# Patient Record
Sex: Female | Born: 1960 | Race: Black or African American | Hispanic: No | Smoking: Never smoker
Health system: Southern US, Community
[De-identification: ages and names within clinical notes are randomized; demographics above are authoritative.]

## PROBLEM LIST (undated history)

## (undated) DIAGNOSIS — M1990 Unspecified osteoarthritis, unspecified site: Secondary | ICD-10-CM

## (undated) DIAGNOSIS — T7840XA Allergy, unspecified, initial encounter: Secondary | ICD-10-CM

## (undated) DIAGNOSIS — J45909 Unspecified asthma, uncomplicated: Secondary | ICD-10-CM

## (undated) HISTORY — DX: Allergy, unspecified, initial encounter: T78.40XA

## (undated) HISTORY — PX: BREAST BIOPSY: SHX20

## (undated) HISTORY — DX: Unspecified osteoarthritis, unspecified site: M19.90

---

## 2015-10-10 DIAGNOSIS — M47816 Spondylosis without myelopathy or radiculopathy, lumbar region: Secondary | ICD-10-CM | POA: Insufficient documentation

## 2016-12-05 DIAGNOSIS — J4520 Mild intermittent asthma, uncomplicated: Secondary | ICD-10-CM | POA: Insufficient documentation

## 2019-01-09 LAB — HM COLONOSCOPY

## 2020-07-04 DIAGNOSIS — Z124 Encounter for screening for malignant neoplasm of cervix: Secondary | ICD-10-CM | POA: Diagnosis not present

## 2020-07-04 DIAGNOSIS — Z113 Encounter for screening for infections with a predominantly sexual mode of transmission: Secondary | ICD-10-CM | POA: Diagnosis not present

## 2020-07-04 DIAGNOSIS — Z01419 Encounter for gynecological examination (general) (routine) without abnormal findings: Secondary | ICD-10-CM | POA: Diagnosis not present

## 2020-10-03 ENCOUNTER — Encounter (HOSPITAL_COMMUNITY): Payer: Self-pay

## 2020-10-03 ENCOUNTER — Emergency Department (HOSPITAL_COMMUNITY)
Admission: EM | Admit: 2020-10-03 | Discharge: 2020-10-03 | Disposition: A | Discharge status: Home / Self Care | Payer: BC Managed Care – PPO | Attending: Emergency Medicine | Admitting: Emergency Medicine

## 2020-10-03 DIAGNOSIS — Z5321 Procedure and treatment not carried out due to patient leaving prior to being seen by health care provider: Secondary | ICD-10-CM | POA: Insufficient documentation

## 2020-10-03 DIAGNOSIS — R109 Unspecified abdominal pain: Secondary | ICD-10-CM | POA: Diagnosis not present

## 2020-10-03 NOTE — ED Triage Notes (Signed)
Pt presents with c/o abdominal pain that occurred approx one hour ago after eating a passion fruit. Pt reports pain is in the center of her abdomen, pain 5/10. Pt denies any vomiting or diarrhea since the pain started.

## 2021-01-05 ENCOUNTER — Ambulatory Visit (INDEPENDENT_AMBULATORY_CARE_PROVIDER_SITE_OTHER): Payer: BC Managed Care – PPO | Admitting: Sports Medicine

## 2021-01-05 ENCOUNTER — Other Ambulatory Visit: Payer: Self-pay

## 2021-01-05 ENCOUNTER — Encounter: Payer: Self-pay | Admitting: Sports Medicine

## 2021-01-05 DIAGNOSIS — M79671 Pain in right foot: Secondary | ICD-10-CM | POA: Diagnosis not present

## 2021-01-05 DIAGNOSIS — M79672 Pain in left foot: Secondary | ICD-10-CM | POA: Diagnosis not present

## 2021-01-05 DIAGNOSIS — B351 Tinea unguium: Secondary | ICD-10-CM | POA: Diagnosis not present

## 2021-01-05 DIAGNOSIS — B359 Dermatophytosis, unspecified: Secondary | ICD-10-CM

## 2021-01-05 MED ORDER — TERBINAFINE HCL 250 MG PO TABS: 250.0000 mg | ORAL_TABLET | Freq: Every day | ORAL | 0 refills | Status: DC

## 2021-01-05 MED ORDER — CLOTRIMAZOLE 1 % EX SOLN: 1.0000 "application " | Freq: Two times a day (BID) | CUTANEOUS | 5 refills | Status: DC

## 2021-01-05 MED ORDER — NYSTATIN-TRIAMCINOLONE 100000-0.1 UNIT/GM-% EX OINT: 1.0000 "application " | TOPICAL_OINTMENT | Freq: Every day | CUTANEOUS | 1 refills | Status: DC
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
## 2021-01-23 LAB — HM MAMMOGRAPHY

## 2021-02-09 ENCOUNTER — Ambulatory Visit: Payer: BC Managed Care – PPO | Admitting: Sports Medicine

## 2021-03-16 ENCOUNTER — Other Ambulatory Visit: Payer: Self-pay

## 2021-03-16 ENCOUNTER — Ambulatory Visit (INDEPENDENT_AMBULATORY_CARE_PROVIDER_SITE_OTHER): Payer: BC Managed Care – PPO | Admitting: Sports Medicine

## 2021-03-16 ENCOUNTER — Encounter: Payer: Self-pay | Admitting: Sports Medicine

## 2021-03-16 DIAGNOSIS — M79671 Pain in right foot: Secondary | ICD-10-CM

## 2021-03-16 DIAGNOSIS — B351 Tinea unguium: Secondary | ICD-10-CM | POA: Diagnosis not present

## 2021-03-16 DIAGNOSIS — M79672 Pain in left foot: Secondary | ICD-10-CM

## 2021-03-16 DIAGNOSIS — B359 Dermatophytosis, unspecified: Secondary | ICD-10-CM | POA: Diagnosis not present

## 2021-03-16 DIAGNOSIS — Z79899 Other long term (current) drug therapy: Secondary | ICD-10-CM

## 2021-03-16 MED ORDER — NYSTATIN-TRIAMCINOLONE 100000-0.1 UNIT/GM-% EX OINT: 1.0000 "application " | TOPICAL_OINTMENT | Freq: Every day | CUTANEOUS | 1 refills | Status: DC

## 2021-03-16 NOTE — Progress Notes (Signed)
Subjective: Mikayla Austin is a 60 y.o. female patient seen today in office for follow-up evaluation of tinea.  Patient reports that she has not started the oral Lamisil she was concerned about possible side effects and want to discuss it first or either have a blood test before beginning this medication.  Patient reports that she has been using the solution and cream denies any other pedal complaints at this time currently.  There are no problems to display for this patient.   Current Outpatient Medications on File Prior to Visit  Medication Sig Dispense Refill   clotrimazole (LOTRIMIN) 1 % external solution Apply 1 application topically 2 (two) times daily. In between toes and to nails 60 mL 5   terbinafine (LAMISIL) 250 MG tablet Take 1 tablet (250 mg total) by mouth daily. 30 tablet 0   No current facility-administered medications on file prior to visit.    Allergies  Allergen Reactions   Chlorpheniramine     Objective: Physical Exam  General: Well developed, nourished, no acute distress, awake, alert and oriented x 3  Vascular: Dorsalis pedis artery 2/4 bilateral, Posterior tibial artery 2/4 bilateral, skin temperature warm to warm proximal to distal bilateral lower extremities, no varicosities, pedal hair present bilateral.  Neurological: Gross sensation present via light touch bilateral.   Dermatological: Skin is warm, dry, and supple bilateral, Nails 1-10 are tender, short thick, and discolored with mild subungal debris, + scaling plantar surfaces of both feet, no webspace macerations present bilateral, no open lesions present bilateral,+ soft tissue swelling/lipomas at ankles L>R. No signs of infection bilateral.  Musculoskeletal: Pes planus boney deformities noted bilateral. No significant instability of ankles, history of ankle sprains. Muscular strength within normal limits without pain on range of motion. No pain with calf compression bilateral.  Assessment and Plan:   Problem List Items Addressed This Visit   None Visit Diagnoses     Encounter for long-term current use of medication    -  Primary   Relevant Orders   Hepatic Function Panel   Tinea       Relevant Medications   nystatin-triamcinolone ointment (MYCOLOG)   Nail fungus       Relevant Medications   nystatin-triamcinolone ointment (MYCOLOG)   Foot pain, bilateral          -Examined patient -Re-Discussed treatment options for tinea, and nail changes likely secondary onychomycosis -Continue with clotrimazole solution to nails and in between toes -Refilled Mycolog to use at bedtime -Continue with foot miracle cream or daily skin emollients to use as directed after shower -Rx liver panel and if normal we will call patient and start her on oral Lamisil she already has 30 tablets since she previously pick up the prescription but did not take it yet -Recommend good hygiene habits -Return to office after blood work or sooner if problems or issues arise. Asencion Islam, DPM

## 2021-03-17 DIAGNOSIS — Z79899 Other long term (current) drug therapy: Secondary | ICD-10-CM | POA: Diagnosis not present

## 2021-03-18 LAB — HEPATIC FUNCTION PANEL
AG Ratio: 1.7 (calc) (ref 1.0–2.5)
ALT: 15 U/L (ref 6–29)
AST: 18 U/L (ref 10–35)
Albumin: 4.4 g/dL (ref 3.6–5.1)
Alkaline phosphatase (APISO): 60 U/L (ref 37–153)
Bilirubin, Direct: 0.2 mg/dL (ref 0.0–0.2)
Globulin: 2.6 g/dL (calc) (ref 1.9–3.7)
Indirect Bilirubin: 0.8 mg/dL (calc) (ref 0.2–1.2)
Total Bilirubin: 1 mg/dL (ref 0.2–1.2)
Total Protein: 7 g/dL (ref 6.1–8.1)

## 2021-03-19 ENCOUNTER — Other Ambulatory Visit: Payer: Self-pay | Admitting: Sports Medicine

## 2021-03-19 MED ORDER — TERBINAFINE HCL 250 MG PO TABS: 250.0000 mg | ORAL_TABLET | Freq: Every day | ORAL | 0 refills | Status: DC

## 2021-03-19 NOTE — Progress Notes (Signed)
Blood work normal

## 2021-03-21 ENCOUNTER — Telehealth: Payer: Self-pay | Admitting: *Deleted

## 2021-03-21 NOTE — Telephone Encounter (Signed)
-----   Message from Asencion Islam, [place] sent at 03/19/2021  9:55 AM EDT ----- Will you let patient know that her liver panel is normal she can start taking the Lamisil pill for nail/skin fungus. I have sent an additional 60 tabs to her pharmacy, She already has an rx for 30 tabs. She should take Lamisil for a total of 90 days. Also tell patient make sure she has a follow up to see me in about 6 weeks. Thanks Dr. Kathie Rhodes

## 2021-03-21 NOTE — Telephone Encounter (Signed)
Returned call to patient, no answer, left vmessage concerning lab results and Dr Wynema Birch  recommendations for starting Lamisil treatment.

## 2021-03-24 ENCOUNTER — Other Ambulatory Visit: Payer: Self-pay | Admitting: Sports Medicine

## 2021-03-24 ENCOUNTER — Telehealth: Payer: Self-pay | Admitting: Sports Medicine

## 2021-03-24 MED ORDER — TERBINAFINE HCL 250 MG PO TABS: 250.0000 mg | ORAL_TABLET | Freq: Every day | ORAL | 0 refills | Status: DC

## 2021-03-24 NOTE — Telephone Encounter (Signed)
Patient would like Lamisil resent to the [facility] on [facility]

## 2021-03-24 NOTE — Progress Notes (Signed)
Sent Lamisil to [facility]

## 2021-03-27 ENCOUNTER — Other Ambulatory Visit: Payer: Self-pay | Admitting: *Deleted

## 2021-04-06 ENCOUNTER — Ambulatory Visit (HOSPITAL_COMMUNITY)
Admission: RE | Admit: 2021-04-06 | Discharge: 2021-04-06 | Disposition: A | Discharge status: Home / Self Care | Payer: BC Managed Care – PPO | Source: Ambulatory Visit | Attending: Emergency Medicine | Admitting: Emergency Medicine

## 2021-04-06 ENCOUNTER — Other Ambulatory Visit: Payer: Self-pay

## 2021-04-06 ENCOUNTER — Encounter (HOSPITAL_COMMUNITY): Payer: Self-pay

## 2021-04-06 VITALS — BP 109/65 | HR 80 | Temp 98.2°F | Resp 18

## 2021-04-06 DIAGNOSIS — L089 Local infection of the skin and subcutaneous tissue, unspecified: Secondary | ICD-10-CM | POA: Diagnosis not present

## 2021-04-06 MED ORDER — DOXYCYCLINE HYCLATE 100 MG PO CAPS: 100.0000 mg | ORAL_CAPSULE | Freq: Two times a day (BID) | ORAL | 0 refills | Status: DC

## 2021-04-06 NOTE — ED Provider Notes (Signed)
[facility]    CSN: [id number] Arrival date & time: 04/06/21  0845      History   Chief Complaint Chief Complaint  Patient presents with   APPOINTMENT : Insect Bite    HPI Mikayla Austin is a 60 y.o. female.   Patient presents with rash on left shin beginning 3 days ago after cutting bushes and gardening, thinks it is an unknown bug bite.  Area has not increased in size . has swelling to the lower extremity, area is warm to touch.  Rash is pruritic.  Denies fever, chills, drainage. has attempted use of onion, topical salve with no improvement.   History reviewed. No pertinent past medical history.  There are no problems to display for this patient.   History reviewed. No pertinent surgical history.  OB History   No obstetric history on file.      Home Medications    Prior to Admission medications   Medication Sig Start Date End Date Taking? Authorizing Provider  doxycycline (VIBRAMYCIN) 100 MG capsule Take 1 capsule (100 mg total) by mouth 2 (two) times daily. 04/06/21  Yes Johntavious Francom R, NP  clotrimazole (LOTRIMIN) 1 % external solution Apply 1 application topically 2 (two) times daily. In between toes and to nails 01/05/21   Asencion Islam, DPM  nystatin-triamcinolone ointment (MYCOLOG) Apply 1 application topically daily. 03/16/21   Asencion Islam, DPM  terbinafine (LAMISIL) 250 MG tablet Take 1 tablet (250 mg total) by mouth daily. 01/05/21   Asencion Islam, DPM  terbinafine (LAMISIL) 250 MG tablet Take 1 tablet (250 mg total) by mouth daily. 03/24/21   Asencion Islam, DPM    Family History Family History  Family history unknown: Yes    Social History     Allergies   Chlorpheniramine   Review of Systems Review of Systems  Constitutional: Negative.   Respiratory: Negative.    Cardiovascular: Negative.   Gastrointestinal: Negative.   Skin:  Positive for rash. Negative for color change, pallor and wound.  Neurological: Negative.      Physical Exam Triage Vital Signs ED Triage Vitals  Enc Vitals Group     BP 04/06/21 0919 109/65     Pulse Rate 04/06/21 0919 80     Resp 04/06/21 0919 18     Temp 04/06/21 0919 98.2 F (36.8 C)     Temp Source 04/06/21 0919 Oral     SpO2 04/06/21 0919 100 %     Weight --      Height --      Head Circumference --      Peak Flow --      Pain Score 04/06/21 0921 0     Pain Loc --      Pain Edu? --      Excl. in GC? --    No data found.  Updated Vital Signs BP 109/65 (BP Location: Right Arm)   Pulse 80   Temp 98.2 F (36.8 C) (Oral)   Resp 18   SpO2 100%   Visual Acuity Right Eye Distance:   Left Eye Distance:   Bilateral Distance:    Right Eye Near:   Left Eye Near:    Bilateral Near:     Physical Exam Constitutional:      Appearance: Normal appearance. She is normal weight.  HENT:     Head: Normocephalic.  Eyes:     Extraocular Movements: Extraocular movements intact.  Pulmonary:     Effort: Pulmonary effort is  normal.  Skin:    Comments: defer to photo below, mild swelling to the mid left lower extremity and left ankle swollen, skin is intact, with no entry point noted, no drainage noted  Neurological:     Mental Status: She is alert and oriented to person, place, and time. Mental status is at baseline.  Psychiatric:        Mood and Affect: Mood normal.        Behavior: Behavior normal.      UC Treatments / Results  Labs (all labs ordered are listed, but only abnormal results are displayed) Labs Reviewed - No data to display  EKG   Radiology No results found.  Procedures Procedures (including critical care time)  Medications Ordered in UC Medications - No data to display  Initial Impression / Assessment and Plan / UC Course  I have reviewed the triage vital signs and the nursing notes.  Pertinent labs & imaging results that were available during my care of the patient were reviewed by me and considered in my medical decision making  (see chart for details).  Soft tissue infection   1.  Doxycycline 100 mg twice daily for 7 days 2.  Over-the-counter oral or topical antihistamine as needed for itching 3.  Return precautions given for persistent or worsening rash for follow-up urgent care Final Clinical Impressions(s) / UC Diagnoses   Final diagnoses:  Soft tissue infection     Discharge Instructions      Take antibiotic twice a day for the 7 days  You may apply topical ointment such as calamine lotion or Benadryl cream directly over rash to help with itching or take oral antihistamine such as Zyrtec Claritin or Benadryl to assist with itching  Please follow-up if rash begins to spread, becomes tender or painful, swelling increases, you begin to have fevers or chills   ED Prescriptions     Medication Sig Dispense Auth. Provider   doxycycline (VIBRAMYCIN) 100 MG capsule Take 1 capsule (100 mg total) by mouth 2 (two) times daily. 14 capsule Azekiel Cremer, Elita Boone, NP      PDMP not reviewed this encounter.   Valinda Hoar, NP 04/06/21 1054

## 2021-04-06 NOTE — ED Triage Notes (Signed)
Pt presents with insect bite to left leg X 3 days.

## 2021-04-06 NOTE — Discharge Instructions (Addendum)
Take antibiotic twice a day for the 7 days  You may apply topical ointment such as calamine lotion or Benadryl cream directly over rash to help with itching or take oral antihistamine such as Zyrtec Claritin or Benadryl to assist with itching  Please follow-up if rash begins to spread, becomes tender or painful, swelling increases, you begin to have fevers or chills

## 2021-04-10 ENCOUNTER — Ambulatory Visit
Admission: EM | Admit: 2021-04-10 | Discharge: 2021-04-10 | Disposition: A | Discharge status: Home / Self Care | Payer: BC Managed Care – PPO | Attending: Family Medicine | Admitting: Family Medicine

## 2021-04-10 ENCOUNTER — Other Ambulatory Visit: Payer: Self-pay

## 2021-04-10 DIAGNOSIS — L03116 Cellulitis of left lower limb: Secondary | ICD-10-CM | POA: Diagnosis not present

## 2021-04-10 MED ORDER — SULFAMETHOXAZOLE-TRIMETHOPRIM 800-160 MG PO TABS: 1.0000 | ORAL_TABLET | Freq: Two times a day (BID) | ORAL | 0 refills | Status: DC

## 2021-04-10 NOTE — ED Triage Notes (Signed)
Pt reports insect bite from last Sunday. Was seen at urgent care on 8/4 and placed on abx that are not working per pt. Patient does not know what insect it was. LLE swollen and reddened and itchy per patient.

## 2021-05-03 ENCOUNTER — Other Ambulatory Visit: Payer: Self-pay

## 2021-05-03 ENCOUNTER — Ambulatory Visit (HOSPITAL_BASED_OUTPATIENT_CLINIC_OR_DEPARTMENT_OTHER)
Admission: RE | Admit: 2021-05-03 | Discharge: 2021-05-03 | Disposition: A | Discharge status: Home / Self Care | Payer: BC Managed Care – PPO | Source: Ambulatory Visit | Attending: Emergency Medicine | Admitting: Emergency Medicine

## 2021-05-03 ENCOUNTER — Ambulatory Visit
Admission: RE | Admit: 2021-05-03 | Discharge: 2021-05-03 | Disposition: A | Discharge status: Home / Self Care | Payer: BC Managed Care – PPO | Source: Ambulatory Visit | Attending: Emergency Medicine | Admitting: Emergency Medicine

## 2021-05-03 VITALS — BP 112/80 | HR 78 | Temp 97.8°F

## 2021-05-03 DIAGNOSIS — M545 Low back pain, unspecified: Secondary | ICD-10-CM

## 2021-05-03 DIAGNOSIS — M47812 Spondylosis without myelopathy or radiculopathy, cervical region: Secondary | ICD-10-CM | POA: Insufficient documentation

## 2021-05-03 DIAGNOSIS — M549 Dorsalgia, unspecified: Secondary | ICD-10-CM | POA: Diagnosis not present

## 2021-05-03 DIAGNOSIS — S161XXA Strain of muscle, fascia and tendon at neck level, initial encounter: Secondary | ICD-10-CM | POA: Diagnosis not present

## 2021-05-03 DIAGNOSIS — S3992XA Unspecified injury of lower back, initial encounter: Secondary | ICD-10-CM | POA: Diagnosis not present

## 2021-05-03 DIAGNOSIS — Z041 Encounter for examination and observation following transport accident: Secondary | ICD-10-CM | POA: Diagnosis not present

## 2021-05-03 MED ORDER — TIZANIDINE HCL 2 MG PO TABS: 2.0000 mg | ORAL_TABLET | Freq: Four times a day (QID) | ORAL | 0 refills | Status: DC | PRN

## 2021-05-03 MED ORDER — IBUPROFEN 800 MG PO TABS: 800.0000 mg | ORAL_TABLET | Freq: Three times a day (TID) | ORAL | 0 refills | Status: DC
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
## 2021-05-03 NOTE — Discharge Instructions (Addendum)
Please go for x-rays-I will call with results Use anti-inflammatories for pain/swelling. You may take up to 800 mg Ibuprofen every 8 hours with food. You may supplement Ibuprofen with Tylenol [phone] mg every 8 hours.  Supplement tizanidine at home/bedtime-this is a muscle relaxer, will cause drowsiness, do not drive or work after taking Alternate ice and heat Gentle stretching of neck and back Avoid complete bedrest Please follow-up if not improving or worsening

## 2021-05-07 ENCOUNTER — Encounter (HOSPITAL_BASED_OUTPATIENT_CLINIC_OR_DEPARTMENT_OTHER): Payer: Self-pay | Admitting: Obstetrics and Gynecology

## 2021-05-07 ENCOUNTER — Other Ambulatory Visit: Payer: Self-pay

## 2021-05-07 ENCOUNTER — Emergency Department (HOSPITAL_BASED_OUTPATIENT_CLINIC_OR_DEPARTMENT_OTHER): Payer: BC Managed Care – PPO

## 2021-05-07 ENCOUNTER — Emergency Department (HOSPITAL_BASED_OUTPATIENT_CLINIC_OR_DEPARTMENT_OTHER)
Admission: EM | Admit: 2021-05-07 | Discharge: 2021-05-07 | Disposition: A | Discharge status: Home / Self Care | Payer: BC Managed Care – PPO | Attending: Emergency Medicine | Admitting: Emergency Medicine

## 2021-05-07 DIAGNOSIS — M542 Cervicalgia: Secondary | ICD-10-CM | POA: Diagnosis not present

## 2021-05-07 DIAGNOSIS — M546 Pain in thoracic spine: Secondary | ICD-10-CM | POA: Insufficient documentation

## 2021-05-07 DIAGNOSIS — R519 Headache, unspecified: Secondary | ICD-10-CM | POA: Diagnosis not present

## 2021-05-07 DIAGNOSIS — M7918 Myalgia, other site: Secondary | ICD-10-CM

## 2021-05-07 DIAGNOSIS — Y92410 Unspecified street and highway as the place of occurrence of the external cause: Secondary | ICD-10-CM | POA: Diagnosis not present
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
## 2021-05-22 NOTE — ED Provider Notes (Signed)
[facility]    CSN: [id number] Arrival date & time: 04/10/21  1325      History   Chief Complaint Chief Complaint  Patient presents with   Insect Bite    HPI Mikayla Austin is a 60 y.o. female. Seen on 6-4 and begun on Doxycycline for cellulitis. Pt reports worsening since that tome. No fever or drainage.  HPI  History reviewed. No pertinent past medical history.  There are no problems to display for this patient.   History reviewed. No pertinent surgical history.  OB History     Gravida      Para      Term      Preterm      AB      Living  2      SAB      IAB      Ectopic      Multiple      Live Births               Home Medications    Prior to Admission medications   Medication Sig Start Date End Date Taking? Authorizing Provider  doxycycline (VIBRAMYCIN) 100 MG capsule Take 1 capsule (100 mg total) by mouth 2 (two) times daily. 04/06/21  Yes White, Adrienne R, NP  terbinafine (LAMISIL) 250 MG tablet Take 1 tablet (250 mg total) by mouth daily. 01/05/21  Yes Stover, Titorya, DPM  clotrimazole (LOTRIMIN) 1 % external solution Apply 1 application topically 2 (two) times daily. In between toes and to nails 01/05/21   Asencion Islam, DPM  ibuprofen (ADVIL) 800 MG tablet Take 1 tablet (800 mg total) by mouth 3 (three) times daily. 05/03/21   Wieters, Hallie C, PA-C  nystatin-triamcinolone ointment (MYCOLOG) Apply 1 application topically daily. 03/16/21   Asencion Islam, DPM  terbinafine (LAMISIL) 250 MG tablet Take 1 tablet (250 mg total) by mouth daily. 03/24/21   Asencion Islam, DPM  tiZANidine (ZANAFLEX) 2 MG tablet Take 1-2 tablets (2-4 mg total) by mouth every 6 (six) hours as needed for muscle spasms. 05/03/21   Wieters, Junius Creamer, PA-C    Family History Family History  Family history unknown: Yes    Social History Social History   Tobacco Use   Smoking status: Never   Smokeless tobacco: Never  Vaping Use   Vaping Use: Never  used  Substance Use Topics   Alcohol use: Yes    Alcohol/week: 4.0 standard drinks    Types: 4 Glasses of wine per week   Drug use: Never     Allergies   Chlorpheniramine   Review of Systems Review of Systems  Skin:  Positive for rash.       Erythema L lower leg    Physical Exam Triage Vital Signs ED Triage Vitals  Enc Vitals Group     BP 04/10/21 1357 117/80     Pulse Rate 04/10/21 1357 69     Resp 04/10/21 1357 18     Temp 04/10/21 1357 98.9 F (37.2 C)     Temp Source 04/10/21 1357 Oral     SpO2 04/10/21 1357 98 %     Weight --      Height --      Head Circumference --      Peak Flow --      Pain Score 04/10/21 1356 0     Pain Loc --      Pain Edu? --      Excl. in GC? --  No data found.  Updated Vital Signs BP 117/80 (BP Location: Left Arm)   Pulse 69   Temp 98.9 F (37.2 C) (Oral)   Resp 18   SpO2 98%   Visual Acuity Right Eye Distance:   Left Eye Distance:   Bilateral Distance:    Right Eye Near:   Left Eye Near:    Bilateral Near:     Physical Exam Vitals and nursing note reviewed.  Constitutional:      Appearance: Normal appearance.  Cardiovascular:     Rate and Rhythm: Normal rate and regular rhythm.  Pulmonary:     Effort: Pulmonary effort is normal.     Breath sounds: Normal breath sounds.  Skin:    Findings: Erythema present.     Comments: LLE  Neurological:     Mental Status: She is alert.     UC Treatments / Results  Labs (all labs ordered are listed, but only abnormal results are displayed) Labs Reviewed - No data to display  EKG   Radiology No results found.  Procedures Procedures (including critical care time)  Medications Ordered in UC Medications - No data to display  Initial Impression / Assessment and Plan / UC Course  I have reviewed the triage vital signs and the nursing notes.  Pertinent labs & imaging results that were available during my care of the patient were reviewed by me and considered in  my medical decision making (see chart for details).     Cellulitis left lower leg without systemic symptoms. Since the cellulitis is not improving, a change in therapy seems indicated.  Will change to Septra DS Final Clinical Impressions(s) / UC Diagnoses   Final diagnoses:  Cellulitis of left lower extremity   Discharge Instructions   None    ED Prescriptions     Medication Sig Dispense Auth. Provider   sulfamethoxazole-trimethoprim (BACTRIM DS) 800-160 MG tablet Take 1 tablet by mouth 2 (two) times daily for 7 days. 14 tablet Frederica Kuster, MD      PDMP not reviewed this encounter.   Frederica Kuster, MD 05/22/21 [facility] Guardian

## 2021-05-29 ENCOUNTER — Ambulatory Visit: Payer: BC Managed Care – PPO | Admitting: Physician Assistant

## 2021-06-05 ENCOUNTER — Ambulatory Visit (INDEPENDENT_AMBULATORY_CARE_PROVIDER_SITE_OTHER): Payer: BC Managed Care – PPO | Admitting: Orthopaedic Surgery

## 2021-06-05 DIAGNOSIS — M542 Cervicalgia: Secondary | ICD-10-CM

## 2021-06-05 DIAGNOSIS — M545 Low back pain, unspecified: Secondary | ICD-10-CM

## 2021-06-05 DIAGNOSIS — G4486 Cervicogenic headache: Secondary | ICD-10-CM

## 2021-06-05 MED ORDER — METHOCARBAMOL 750 MG PO TABS: 750.0000 mg | ORAL_TABLET | Freq: Four times a day (QID) | ORAL | 1 refills | Status: DC

## 2021-06-05 MED ORDER — METHYLPREDNISOLONE 4 MG PO TABS: ORAL_TABLET | ORAL | 0 refills | Status: DC
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
## 2021-06-06 ENCOUNTER — Other Ambulatory Visit: Payer: Self-pay

## 2021-06-06 DIAGNOSIS — G4486 Cervicogenic headache: Secondary | ICD-10-CM

## 2021-06-06 DIAGNOSIS — M542 Cervicalgia: Secondary | ICD-10-CM

## 2021-06-23 ENCOUNTER — Encounter: Payer: Self-pay | Admitting: Rehabilitative and Restorative Service Providers"

## 2021-06-23 ENCOUNTER — Other Ambulatory Visit: Payer: Self-pay

## 2021-06-23 ENCOUNTER — Ambulatory Visit (INDEPENDENT_AMBULATORY_CARE_PROVIDER_SITE_OTHER): Payer: BC Managed Care – PPO | Admitting: Rehabilitative and Restorative Service Providers"

## 2021-06-23 DIAGNOSIS — R262 Difficulty in walking, not elsewhere classified: Secondary | ICD-10-CM | POA: Diagnosis not present

## 2021-06-23 DIAGNOSIS — R293 Abnormal posture: Secondary | ICD-10-CM | POA: Diagnosis not present

## 2021-06-23 DIAGNOSIS — R600 Localized edema: Secondary | ICD-10-CM

## 2021-06-23 DIAGNOSIS — M6281 Muscle weakness (generalized): Secondary | ICD-10-CM | POA: Diagnosis not present

## 2021-06-23 DIAGNOSIS — M542 Cervicalgia: Secondary | ICD-10-CM
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
## 2021-06-23 NOTE — Patient Instructions (Signed)
Access Code: [email or web address] URL: [email or web address] Date: 06/23/2021 Prepared by: Pauletta Browns  Exercises Standing Scapular Retraction - 5 x daily - 7 x weekly - 1 sets - 5 reps - 5 second hold Standing Isometric Cervical Extension with Manual Resistance - 5 x daily - 7 x weekly - 1 sets - 5 reps - 5 hold Seated Cervical Rotation AROM - 3-5 x daily - 7 x weekly - 1 sets - 10 reps - 5 seconds hold Standing Lumbar Extension at Wall - Forearms - 5 x daily - 7 x weekly - 1 sets - 5 reps - 3 seconds hold

## 2021-06-29 ENCOUNTER — Ambulatory Visit (INDEPENDENT_AMBULATORY_CARE_PROVIDER_SITE_OTHER): Payer: BC Managed Care – PPO | Admitting: Rehabilitative and Restorative Service Providers"

## 2021-06-29 ENCOUNTER — Encounter: Payer: Self-pay | Admitting: Rehabilitative and Restorative Service Providers"

## 2021-06-29 ENCOUNTER — Other Ambulatory Visit: Payer: Self-pay

## 2021-06-29 DIAGNOSIS — M6281 Muscle weakness (generalized): Secondary | ICD-10-CM | POA: Diagnosis not present

## 2021-06-29 DIAGNOSIS — M542 Cervicalgia: Secondary | ICD-10-CM

## 2021-06-29 DIAGNOSIS — R293 Abnormal posture: Secondary | ICD-10-CM

## 2021-06-29 DIAGNOSIS — R600 Localized edema: Secondary | ICD-10-CM | POA: Diagnosis not present

## 2021-06-29 DIAGNOSIS — R262 Difficulty in walking, not elsewhere classified: Secondary | ICD-10-CM | POA: Diagnosis not present

## 2021-06-29 NOTE — Therapy (Signed)
[facility] [address] [place], [place], [address] Phone: [phone]   Fax:  [phone]  Physical Therapy Treatment  Patient Details  Name: Mikayla Austin MRN: [id number] Date of Birth: 24-Oct-1960 Referring Provider (PT): [name] MD   Encounter Date: 06/29/2021   PT End of Session - 06/29/21 1547     Visit Number 2    Number of Visits 12    Date for PT Re-Evaluation 09/15/21    Progress Note Due on Visit 12    PT Start Time 1455    PT Stop Time 1540    PT Time Calculation (min) 45 min    Activity Tolerance Patient tolerated treatment well    Behavior During Therapy [facility] for tasks assessed/performed             History reviewed. No pertinent past medical history.  History reviewed. No pertinent surgical history.  There were no vitals filed for this visit.   Subjective Assessment - 06/29/21 1503     Subjective Mikayla Austin reports good early HEP compliance.  Sleep is still a problem, particularly 2 nights ago which is likely related to the weather.    Pertinent History NA    Limitations Sitting;Walking;Reading;Writing;Lifting;House hold activities;Standing    How long can you sit comfortably? 30 minutes    How long can you stand comfortably? 30 minutes    How long can you walk comfortably? Has not returned to walking for exercise.    Patient Stated Goals Return to pain-free full function.    Currently in Pain? Yes    Pain Score 4     Pain Location Neck    Pain Descriptors / Indicators Aching;Sore;Spasm    Pain Type Acute pain    Pain Radiating Towards Headaches    Pain Onset More than a month ago    Pain Frequency Constant    Aggravating Factors  Prolonged sitting at work and activities at home    Pain Relieving Factors Some relief with pain meds    Effect of Pain on Daily Activities Not doing normal workouts    Multiple Pain Sites No                               [facility] Adult PT  Treatment/Exercise - 06/29/21 0001       Posture/Postural Control   Posture/Postural Control Postural limitations    Postural Limitations Forward head;Rounded Shoulders;Flexed trunk      Therapeutic Activites    Therapeutic Activities ADL's    ADL's Log roll, golfer's lift, review of walking and HEP      Exercises   Exercises Lumbar;Neck      Neck Exercises: Standing   Other Standing Exercises Cervical AROM Rotation (SBP 1st) 10X 5 seconds    Other Standing Exercises Cervical extension Isometrics into pillow 10X 5 seconds Extension only      Neck Exercises: Seated   Other Seated Exercise Shoulder blade pinches 10X 5 seconds      Lumbar Exercises: Stretches   Figure 4 Stretch 4 reps;20 seconds    Other Lumbar Stretch Exercise Standing lumbar extension/trunk extension 10X 3 seconds      Lumbar Exercises: Standing   Heel Raises 10 reps;3 seconds    Heel Raises Limitations With transversus abdominus activation and wall on wall    Other Standing Lumbar Exercises Alternating hip hike 10X 3 seconds      Lumbar Exercises: Supine   Bridge  Non-compliant;10 reps;5 seconds      Lumbar Exercises: Prone   Straight Leg Raise 10 reps;3 seconds                     PT Education - 06/29/21 1546     Education Details Reviewed day 1 HEP with 3 additions (see patient instructions).  Basic body mechanics and reviewed home walking program.    Person(s) Educated Patient    Methods Explanation;Demonstration;Verbal cues;Handout    Comprehension Verbalized understanding;Returned demonstration;Need further instruction;Verbal cues required              PT Short Term Goals - 06/23/21 1719       PT SHORT TERM GOAL #1   Title Mikayla Austin will improve cervical rotation AROM to 60 degrees.    Baseline 45-50 degrees    Time 6    Period Weeks    Status New    Target Date 08/04/21      PT SHORT TERM GOAL #2   Title Improve trunk extension AROM to 20 degrees.    Baseline 15 degrees     Time 6    Period Weeks    Status New    Target Date 08/04/21               PT Long Term Goals - 06/23/21 1721       PT LONG TERM GOAL #1   Title Improve FOTO to 65.    Baseline 50    Time 12    Period Weeks    Status New    Target Date 09/15/21      PT LONG TERM GOAL #2   Title Mikayla Austin will report cervicala nd low back pain consistently <3/10 on the Numeric Pain Rating Scale with no headaches in 2+ weeks.    Baseline Can be 5+/10 with headaches    Time 12    Period Weeks    Status New    Target Date 09/15/21      PT LONG TERM GOAL #3   Title Improve cervical and spine strength as assessed by objective strength tests.    Baseline Cervical extension ~ 35 pounds (40 Goal), cervical lateral bending ~ 11 & ~ 13 pounds (25 Goal), spine strength test deferred (60 seconds spine strength test Goal).    Time 12    Period Weeks    Status New    Target Date 09/15/21      PT LONG TERM GOAL #4   Title Mikayla Austin will be independent with her long-term HEP at [place].    Baseline Prescribed today    Time 12    Period Weeks    Status New    Target Date 09/15/21                   Plan - 06/29/21 1548     Clinical Impression Statement Mikayla Austin reports good early home walking and exercise compliance.  She is limited by spasm and fatigues easily.  Progressions were made, particularly with strength and body mechanics (including sleep positions).  Continue practical mechanics work, cervical, scapular and lumbar strengthening to meet LTGs.    Examination-Activity Limitations Stand;Bed Mobility;Bend;Sit;Sleep;Lift;Carry;Locomotion Level    Examination-Participation Restrictions Occupation;Community Activity;Driving    Stability/Clinical Decision Making Stable/Uncomplicated    Rehab Potential Good    PT Frequency 1x / week    PT Duration 12 weeks    PT Treatment/Interventions ADLs/Self Care Home Management;Electrical Stimulation;Cryotherapy;Moist Heat;Therapeutic  activities;Neuromuscular re-education;Therapeutic exercise;Patient/family education;Manual techniques;Dry  needling    PT Next Visit Plan Body mechanics, progress cervical, scapular and low back strength    PT Home Exercise Plan [email or web address]    Consulted and Agree with Plan of Care Patient             Patient will benefit from skilled therapeutic intervention in order to improve the following deficits and impairments:  Decreased activity tolerance, Decreased endurance, Decreased range of motion, Difficulty walking, Decreased strength, Increased edema, Increased muscle spasms, Impaired flexibility, Improper body mechanics, Postural dysfunction, Pain  Visit Diagnosis: Abnormal posture  Difficulty walking  Localized edema  Muscle weakness (generalized)  Cervicalgia     Problem List There are no problems to display for this patient.   Cherlyn Cushing, PT, MPT 06/29/2021, 3:50 PM  [facility] Physical Therapy [address] [place], [place], [address] Phone: [phone]   Fax:  [phone]  Name: Shaundrea Carrigg MRN: [id number] Date of Birth: 1961/05/01

## 2021-06-29 NOTE — Patient Instructions (Signed)
Access Code: [email or web address] URL: [email or web address] Date: 06/29/2021 Prepared by: Pauletta Browns  Exercises Standing Scapular Retraction - 5 x daily - 7 x weekly - 1 sets - 5 reps - 5 second hold Standing Isometric Cervical Extension with Manual Resistance - 5 x daily - 7 x weekly - 1 sets - 5 reps - 5 hold Seated Cervical Rotation AROM - 3-5 x daily - 7 x weekly - 1 sets - 10 reps - 5 seconds hold Standing Lumbar Extension at Wall - Forearms - 5 x daily - 7 x weekly - 1 sets - 5 reps - 3 seconds hold Supine Figure 4 Piriformis Stretch - 2 x daily - 7 x weekly - 1 sets - 5 reps - 20 seconds hold Bridge - 2 x daily - 7 x weekly - 1 sets - 10 reps - 5 seconds hold Prone Hip Extension - 2 x daily - 7 x weekly - 1 sets - 10 reps - 3 seconds hold

## 2021-07-03 ENCOUNTER — Encounter: Payer: Self-pay | Admitting: Psychiatry

## 2021-07-03 ENCOUNTER — Ambulatory Visit (INDEPENDENT_AMBULATORY_CARE_PROVIDER_SITE_OTHER): Payer: BC Managed Care – PPO | Admitting: Psychiatry

## 2021-07-03 VITALS — BP 115/77 | HR 76 | Ht 65.5 in | Wt 195.4 lb

## 2021-07-03 DIAGNOSIS — J45909 Unspecified asthma, uncomplicated: Secondary | ICD-10-CM | POA: Diagnosis not present

## 2021-07-03 DIAGNOSIS — F0781 Postconcussional syndrome: Secondary | ICD-10-CM

## 2021-07-03 DIAGNOSIS — R4789 Other speech disturbances: Secondary | ICD-10-CM

## 2021-07-03 MED ORDER — AMITRIPTYLINE HCL 25 MG PO TABS: 25.0000 mg | ORAL_TABLET | Freq: Every day | ORAL | 3 refills | Status: DC
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
## 2021-07-06 ENCOUNTER — Ambulatory Visit (INDEPENDENT_AMBULATORY_CARE_PROVIDER_SITE_OTHER): Payer: BC Managed Care – PPO | Admitting: Orthopaedic Surgery

## 2021-07-06 ENCOUNTER — Other Ambulatory Visit: Payer: Self-pay

## 2021-07-06 ENCOUNTER — Encounter: Payer: Self-pay | Admitting: Orthopaedic Surgery

## 2021-07-06 ENCOUNTER — Ambulatory Visit: Payer: BC Managed Care – PPO | Attending: Infectious Diseases | Admitting: Speech Pathology

## 2021-07-06 ENCOUNTER — Encounter: Payer: Self-pay | Admitting: Speech Pathology

## 2021-07-06 DIAGNOSIS — G4486 Cervicogenic headache: Secondary | ICD-10-CM

## 2021-07-06 DIAGNOSIS — R41841 Cognitive communication deficit: Secondary | ICD-10-CM | POA: Insufficient documentation

## 2021-07-06 DIAGNOSIS — M542 Cervicalgia: Secondary | ICD-10-CM

## 2021-07-06 DIAGNOSIS — M545 Low back pain, unspecified: Secondary | ICD-10-CM
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
## 2021-07-06 NOTE — Addendum Note (Signed)
Addended by: Dorena Bodo on: 07/06/2021 03:46 PM   Modules accepted: Orders
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
## 2021-07-07 ENCOUNTER — Encounter: Payer: Self-pay | Admitting: Rehabilitative and Restorative Service Providers"

## 2021-07-07 ENCOUNTER — Ambulatory Visit (INDEPENDENT_AMBULATORY_CARE_PROVIDER_SITE_OTHER): Payer: BC Managed Care – PPO | Admitting: Rehabilitative and Restorative Service Providers"

## 2021-07-07 DIAGNOSIS — M6281 Muscle weakness (generalized): Secondary | ICD-10-CM

## 2021-07-07 DIAGNOSIS — R262 Difficulty in walking, not elsewhere classified: Secondary | ICD-10-CM

## 2021-07-07 DIAGNOSIS — R293 Abnormal posture: Secondary | ICD-10-CM

## 2021-07-07 DIAGNOSIS — R600 Localized edema: Secondary | ICD-10-CM | POA: Diagnosis not present

## 2021-07-07 DIAGNOSIS — M542 Cervicalgia: Secondary | ICD-10-CM

## 2021-07-07 NOTE — Patient Instructions (Signed)
Access Code: [email or web address] URL: [email or web address] Date: 07/07/2021 Prepared by: Pauletta Browns  Exercises Standing Scapular Retraction - 5 x daily - 7 x weekly - 1 sets - 5 reps - 5 second hold Standing Isometric Cervical Extension with Manual Resistance - 5 x daily - 7 x weekly - 1 sets - 5 reps - 5 hold Seated Cervical Rotation AROM - 3-5 x daily - 7 x weekly - 1 sets - 10 reps - 5 seconds hold Standing Lumbar Extension at Wall - Forearms - 5 x daily - 7 x weekly - 1 sets - 5 reps - 3 seconds hold Supine Figure 4 Piriformis Stretch - 2 x daily - 7 x weekly - 1 sets - 5 reps - 20 seconds hold Bridge - 2 x daily - 7 x weekly - 1 sets - 10 reps - 5 seconds hold Prone Hip Extension - 1 x daily - 7 x weekly - 2 sets - 10 reps - 3 seconds hold Prone Alternating Arm and Leg Lifts - 1 x daily - 7 x weekly - 2 sets - 10 reps - 3-10 seconds hold Standing Hip Hiking - 2 x daily - 7 x weekly - 2 sets - 10 reps - 3 seconds hold
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
## 2021-07-13 ENCOUNTER — Other Ambulatory Visit: Payer: Self-pay

## 2021-07-13 ENCOUNTER — Ambulatory Visit: Payer: BC Managed Care – PPO | Admitting: Speech Pathology

## 2021-07-13 DIAGNOSIS — R41841 Cognitive communication deficit: Secondary | ICD-10-CM
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
## 2021-07-14 ENCOUNTER — Encounter: Payer: BC Managed Care – PPO | Admitting: Speech Pathology

## 2021-07-14 ENCOUNTER — Ambulatory Visit (INDEPENDENT_AMBULATORY_CARE_PROVIDER_SITE_OTHER): Payer: BC Managed Care – PPO | Admitting: Rehabilitative and Restorative Service Providers"

## 2021-07-14 ENCOUNTER — Encounter: Payer: Self-pay | Admitting: Rehabilitative and Restorative Service Providers"

## 2021-07-14 DIAGNOSIS — R293 Abnormal posture: Secondary | ICD-10-CM

## 2021-07-14 DIAGNOSIS — M6281 Muscle weakness (generalized): Secondary | ICD-10-CM | POA: Diagnosis not present

## 2021-07-14 DIAGNOSIS — M542 Cervicalgia: Secondary | ICD-10-CM

## 2021-07-14 DIAGNOSIS — R262 Difficulty in walking, not elsewhere classified: Secondary | ICD-10-CM | POA: Diagnosis not present
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
## 2021-07-14 NOTE — Patient Instructions (Signed)
Access Code: [email or web address] URL: [email or web address] Date: 07/14/2021 Prepared by: Pauletta Browns  Exercises Standing Scapular Retraction - 5 x daily - 7 x weekly - 1 sets - 5 reps - 5 second hold Standing Isometric Cervical Extension with Manual Resistance - 5 x daily - 7 x weekly - 1 sets - 5 reps - 5 hold Seated Cervical Rotation AROM - 1 x daily - 7 x weekly - 1 sets - 10 reps - 5 seconds hold Standing Lumbar Extension at Wall - Forearms - 5 x daily - 7 x weekly - 1 sets - 5 reps - 3 seconds hold Supine Figure 4 Piriformis Stretch - 2 x daily - 7 x weekly - 1 sets - 5 reps - 20 seconds hold Bridge - 2 x daily - 7 x weekly - 1 sets - 10 reps - 5 seconds hold Prone Hip Extension - 1 x daily - 7 x weekly - 2 sets - 10 reps - 3 seconds hold Prone Alternating Arm and Leg Lifts - 1 x daily - 7 x weekly - 2 sets - 10 reps - 3-10 seconds hold Standing Hip Hiking - 2 x daily - 7 x weekly - 2 sets - 10 reps - 3 seconds hold

## 2021-07-18 DIAGNOSIS — Z113 Encounter for screening for infections with a predominantly sexual mode of transmission: Secondary | ICD-10-CM | POA: Diagnosis not present

## 2021-07-18 DIAGNOSIS — Z01419 Encounter for gynecological examination (general) (routine) without abnormal findings: Secondary | ICD-10-CM | POA: Diagnosis not present

## 2021-07-18 LAB — HM PAP SMEAR

## 2021-07-20 ENCOUNTER — Ambulatory Visit (INDEPENDENT_AMBULATORY_CARE_PROVIDER_SITE_OTHER): Payer: BC Managed Care – PPO | Admitting: Rehabilitative and Restorative Service Providers"

## 2021-07-20 ENCOUNTER — Encounter: Payer: BC Managed Care – PPO | Admitting: Rehabilitative and Restorative Service Providers"

## 2021-07-20 ENCOUNTER — Other Ambulatory Visit: Payer: Self-pay

## 2021-07-20 ENCOUNTER — Encounter: Payer: Self-pay | Admitting: Rehabilitative and Restorative Service Providers"

## 2021-07-20 DIAGNOSIS — R262 Difficulty in walking, not elsewhere classified: Secondary | ICD-10-CM | POA: Diagnosis not present

## 2021-07-20 DIAGNOSIS — R293 Abnormal posture: Secondary | ICD-10-CM

## 2021-07-20 DIAGNOSIS — M542 Cervicalgia: Secondary | ICD-10-CM

## 2021-07-20 DIAGNOSIS — M6281 Muscle weakness (generalized): Secondary | ICD-10-CM | POA: Diagnosis not present
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
## 2021-07-20 NOTE — Patient Instructions (Signed)
Increase compliance (currently poor), particularly with walking and prone arm and leg extensions.

## 2021-07-21 ENCOUNTER — Encounter: Payer: Self-pay | Admitting: Speech Pathology

## 2021-07-21 ENCOUNTER — Ambulatory Visit: Payer: BC Managed Care – PPO | Admitting: Speech Pathology

## 2021-07-21 ENCOUNTER — Encounter: Payer: BC Managed Care – PPO | Admitting: Rehabilitative and Restorative Service Providers"

## 2021-07-21 DIAGNOSIS — R41841 Cognitive communication deficit: Secondary | ICD-10-CM | POA: Diagnosis not present
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
## 2021-07-24 ENCOUNTER — Other Ambulatory Visit: Payer: Self-pay

## 2021-07-24 ENCOUNTER — Ambulatory Visit: Payer: BC Managed Care – PPO | Admitting: Speech Pathology

## 2021-07-24 ENCOUNTER — Encounter: Payer: Self-pay | Admitting: Speech Pathology

## 2021-07-24 DIAGNOSIS — R41841 Cognitive communication deficit: Secondary | ICD-10-CM | POA: Diagnosis not present
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
## 2021-07-31 ENCOUNTER — Ambulatory Visit
Admission: RE | Admit: 2021-07-31 | Discharge: 2021-07-31 | Disposition: A | Discharge status: Home / Self Care | Payer: BC Managed Care – PPO | Source: Ambulatory Visit | Attending: Orthopaedic Surgery | Admitting: Orthopaedic Surgery

## 2021-07-31 ENCOUNTER — Encounter: Payer: Self-pay | Admitting: Psychiatry

## 2021-07-31 ENCOUNTER — Other Ambulatory Visit: Payer: Self-pay

## 2021-07-31 DIAGNOSIS — G4486 Cervicogenic headache: Secondary | ICD-10-CM

## 2021-07-31 DIAGNOSIS — M4802 Spinal stenosis, cervical region: Secondary | ICD-10-CM | POA: Diagnosis not present

## 2021-07-31 DIAGNOSIS — M50221 Other cervical disc displacement at C4-C5 level: Secondary | ICD-10-CM | POA: Diagnosis not present

## 2021-07-31 DIAGNOSIS — M50222 Other cervical disc displacement at C5-C6 level: Secondary | ICD-10-CM | POA: Diagnosis not present

## 2021-07-31 DIAGNOSIS — M5023 Other cervical disc displacement, cervicothoracic region: Secondary | ICD-10-CM | POA: Diagnosis not present

## 2021-07-31 DIAGNOSIS — M542 Cervicalgia: Secondary | ICD-10-CM

## 2021-08-01 ENCOUNTER — Other Ambulatory Visit: Payer: Self-pay | Admitting: Psychiatry

## 2021-08-01 DIAGNOSIS — Q048 Other specified congenital malformations of brain: Secondary | ICD-10-CM

## 2021-08-02 ENCOUNTER — Encounter: Payer: Self-pay | Admitting: Orthopaedic Surgery

## 2021-08-02 ENCOUNTER — Ambulatory Visit (INDEPENDENT_AMBULATORY_CARE_PROVIDER_SITE_OTHER): Payer: BC Managed Care – PPO | Admitting: Orthopaedic Surgery

## 2021-08-02 ENCOUNTER — Other Ambulatory Visit: Payer: Self-pay

## 2021-08-02 DIAGNOSIS — M542 Cervicalgia: Secondary | ICD-10-CM | POA: Diagnosis not present

## 2021-08-02 DIAGNOSIS — M5410 Radiculopathy, site unspecified: Secondary | ICD-10-CM

## 2021-08-02 DIAGNOSIS — G4486 Cervicogenic headache: Secondary | ICD-10-CM
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
## 2021-08-03 ENCOUNTER — Other Ambulatory Visit: Payer: Self-pay

## 2021-08-03 ENCOUNTER — Encounter: Payer: Self-pay | Admitting: Speech Pathology

## 2021-08-03 ENCOUNTER — Ambulatory Visit: Payer: BC Managed Care – PPO | Attending: Infectious Diseases | Admitting: Speech Pathology

## 2021-08-03 DIAGNOSIS — R41841 Cognitive communication deficit: Secondary | ICD-10-CM | POA: Insufficient documentation
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
## 2021-08-04 ENCOUNTER — Encounter: Payer: Self-pay | Admitting: Rehabilitative and Restorative Service Providers"

## 2021-08-04 ENCOUNTER — Ambulatory Visit (INDEPENDENT_AMBULATORY_CARE_PROVIDER_SITE_OTHER): Payer: BC Managed Care – PPO | Admitting: Rehabilitative and Restorative Service Providers"

## 2021-08-04 DIAGNOSIS — M6281 Muscle weakness (generalized): Secondary | ICD-10-CM | POA: Diagnosis not present

## 2021-08-04 DIAGNOSIS — R600 Localized edema: Secondary | ICD-10-CM | POA: Diagnosis not present

## 2021-08-04 DIAGNOSIS — M542 Cervicalgia: Secondary | ICD-10-CM

## 2021-08-04 DIAGNOSIS — R293 Abnormal posture: Secondary | ICD-10-CM
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
## 2021-08-04 NOTE — Patient Instructions (Signed)
Access Code: [email or web address] URL: [email or web address] Date: 08/04/2021 Prepared by: Pauletta Browns  Exercises Standing Scapular Retraction - 5 x daily - 7 x weekly - 1 sets - 5 reps - 5 second hold Standing Lumbar Extension at Wall - Forearms - 5 x daily - 7 x weekly - 1 sets - 5 reps - 3 seconds hold Supine Figure 4 Piriformis Stretch - 1-2 x daily - 7 x weekly - 1 sets - 5 reps - 20 seconds hold Bridge - 1-2 x daily - 7 x weekly - 1 sets - 10 reps - 5 seconds hold Prone Hip Extension - 1 x daily - 7 x weekly - 2 sets - 10 reps - 5 seconds hold Prone Alternating Arm and Leg Lifts - 1 x daily - 7 x weekly - 2 sets - 10 reps - 5-10 seconds hold Prone Shoulder Horizontal Abduction with Thumbs Up - 1 x daily - 7 x weekly - 1-2 sets - 10 reps - 3 seconds hold

## 2021-08-09 ENCOUNTER — Other Ambulatory Visit: Payer: Self-pay

## 2021-08-09 ENCOUNTER — Encounter: Payer: Self-pay | Admitting: Speech Pathology

## 2021-08-09 ENCOUNTER — Ambulatory Visit: Payer: BC Managed Care – PPO | Admitting: Speech Pathology

## 2021-08-09 DIAGNOSIS — R41841 Cognitive communication deficit: Secondary | ICD-10-CM | POA: Diagnosis not present
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
## 2021-08-14 DIAGNOSIS — M542 Cervicalgia: Secondary | ICD-10-CM | POA: Diagnosis not present

## 2021-08-14 DIAGNOSIS — Z6832 Body mass index (BMI) 32.0-32.9, adult: Secondary | ICD-10-CM | POA: Diagnosis not present

## 2021-08-15 ENCOUNTER — Other Ambulatory Visit: Payer: Self-pay

## 2021-08-15 ENCOUNTER — Ambulatory Visit: Payer: BC Managed Care – PPO

## 2021-08-15 ENCOUNTER — Telehealth: Payer: Self-pay | Admitting: Psychiatry

## 2021-08-15 DIAGNOSIS — Q048 Other specified congenital malformations of brain: Secondary | ICD-10-CM

## 2021-08-15 MED ORDER — GADOBENATE DIMEGLUMINE 529 MG/ML IV SOLN
15.0000 mL | Freq: Once | INTRAVENOUS | Status: AC | PRN
  Administered 2021-08-15: 15 mL via INTRAVENOUS

## 2021-08-15 NOTE — Telephone Encounter (Signed)
MR Brain w/wo contrast Dr. Oda Kilts Berkley Harvey: NPR Spoke to [facility] Ref # [id number]. Patient is scheduled at [facility] for 08/15/21.

## 2021-08-16 ENCOUNTER — Ambulatory Visit: Payer: BC Managed Care – PPO | Admitting: Speech Pathology

## 2021-08-18 ENCOUNTER — Other Ambulatory Visit: Payer: Self-pay

## 2021-08-18 ENCOUNTER — Ambulatory Visit: Payer: BC Managed Care – PPO | Admitting: Speech Pathology

## 2021-08-18 DIAGNOSIS — R41841 Cognitive communication deficit: Secondary | ICD-10-CM
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
## 2021-08-22 ENCOUNTER — Encounter: Payer: Self-pay | Admitting: Speech Pathology

## 2021-08-22 ENCOUNTER — Other Ambulatory Visit: Payer: Self-pay

## 2021-08-22 ENCOUNTER — Ambulatory Visit: Payer: BC Managed Care – PPO | Admitting: Speech Pathology

## 2021-08-22 DIAGNOSIS — R41841 Cognitive communication deficit: Secondary | ICD-10-CM

## 2021-08-23 ENCOUNTER — Ambulatory Visit (INDEPENDENT_AMBULATORY_CARE_PROVIDER_SITE_OTHER): Payer: BC Managed Care – PPO | Admitting: Rehabilitative and Restorative Service Providers"

## 2021-08-23 ENCOUNTER — Encounter: Payer: Self-pay | Admitting: Rehabilitative and Restorative Service Providers"

## 2021-08-23 DIAGNOSIS — M542 Cervicalgia: Secondary | ICD-10-CM | POA: Diagnosis not present

## 2021-08-23 DIAGNOSIS — M6281 Muscle weakness (generalized): Secondary | ICD-10-CM

## 2021-08-23 DIAGNOSIS — R293 Abnormal posture: Secondary | ICD-10-CM | POA: Diagnosis not present

## 2021-08-23 DIAGNOSIS — R600 Localized edema: Secondary | ICD-10-CM | POA: Diagnosis not present

## 2021-08-23 NOTE — Patient Instructions (Signed)
Access Code: [email or web address] URL: [email or web address] Date: 08/23/2021 Prepared by: Pauletta Browns  Exercises Standing Scapular Retraction - 5 x daily - 7 x weekly - 1 sets - 5 reps - 5 second hold Standing Lumbar Extension at Wall - Forearms - 5 x daily - 7 x weekly - 1 sets - 5 reps - 3 seconds hold Supine Figure 4 Piriformis Stretch - 1-2 x daily - 7 x weekly - 1 sets - 5 reps - 20 seconds hold Bridge - 1-2 x daily - 7 x weekly - 1 sets - 10 reps - 5 seconds hold Prone Hip Extension - 1 x daily - 7 x weekly - 2 sets - 10 reps - 5 seconds hold Prone Alternating Arm and Leg Lifts - 1 x daily - 7 x weekly - 2 sets - 10 reps - 5-10 seconds hold Prone Shoulder Horizontal Abduction with Thumbs Up - 1 x daily - 7 x weekly - 1-2 sets - 10 reps - 3 seconds hold Full Superman on Table - 1 x daily - 7 x weekly - 1 sets - 10 reps - 3-5 seconds hold
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
## 2021-08-30 ENCOUNTER — Encounter: Payer: Self-pay | Admitting: Rehabilitative and Restorative Service Providers"

## 2021-08-30 ENCOUNTER — Ambulatory Visit (INDEPENDENT_AMBULATORY_CARE_PROVIDER_SITE_OTHER): Payer: BC Managed Care – PPO | Admitting: Rehabilitative and Restorative Service Providers"

## 2021-08-30 ENCOUNTER — Other Ambulatory Visit: Payer: Self-pay

## 2021-08-30 DIAGNOSIS — R293 Abnormal posture: Secondary | ICD-10-CM

## 2021-08-30 DIAGNOSIS — R600 Localized edema: Secondary | ICD-10-CM | POA: Diagnosis not present

## 2021-08-30 DIAGNOSIS — M542 Cervicalgia: Secondary | ICD-10-CM | POA: Diagnosis not present

## 2021-08-30 DIAGNOSIS — M6281 Muscle weakness (generalized): Secondary | ICD-10-CM | POA: Diagnosis not present

## 2021-08-30 NOTE — Patient Instructions (Signed)
Continue current HEP with progressions to walking (20+ minutes)
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
## 2021-09-06 ENCOUNTER — Encounter: Payer: BC Managed Care – PPO | Admitting: Rehabilitative and Restorative Service Providers"

## 2021-09-15 ENCOUNTER — Ambulatory Visit (INDEPENDENT_AMBULATORY_CARE_PROVIDER_SITE_OTHER): Payer: BC Managed Care – PPO | Admitting: Rehabilitative and Restorative Service Providers"

## 2021-09-15 ENCOUNTER — Other Ambulatory Visit: Payer: Self-pay

## 2021-09-15 ENCOUNTER — Encounter: Payer: Self-pay | Admitting: Rehabilitative and Restorative Service Providers"

## 2021-09-15 DIAGNOSIS — M542 Cervicalgia: Secondary | ICD-10-CM

## 2021-09-15 DIAGNOSIS — R600 Localized edema: Secondary | ICD-10-CM

## 2021-09-15 DIAGNOSIS — R293 Abnormal posture: Secondary | ICD-10-CM

## 2021-09-15 DIAGNOSIS — M6281 Muscle weakness (generalized): Secondary | ICD-10-CM

## 2021-09-15 NOTE — Patient Instructions (Signed)
Access Code: [email or web address] URL: [email or web address] Date: 09/15/2021 Prepared by: Pauletta Browns  Exercises Standing Scapular Retraction - 5 x daily - 7 x weekly - 1 sets - 5 reps - 5 second hold Standing Lumbar Extension at Wall - Forearms - 2-5 x daily - 7 x weekly - 1 sets - 5 reps - 3 seconds hold Supine Figure 4 Piriformis Stretch - 1-2 x daily - 7 x weekly - 1 sets - 5 reps - 20 seconds hold Bridge - 1 x daily - 1 x weekly - 1 sets - 10 reps - 5 seconds hold Prone Hip Extension - 1 x daily - 7 x weekly - 1 sets - 10 reps - 5 seconds hold Prone Alternating Arm and Leg Lifts - 1 x daily - 7 x weekly - 1 sets - 10 reps - 5-10 seconds hold Prone Shoulder Horizontal Abduction with Thumbs Up - 1 x daily - 7 x weekly - 1 sets - 10 reps - 3 seconds hold Full Superman on Table - 1 x daily - 7 x weekly - 1 sets - 10 reps - 3-5 seconds hold
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
## 2021-10-03 ENCOUNTER — Ambulatory Visit (INDEPENDENT_AMBULATORY_CARE_PROVIDER_SITE_OTHER): Payer: BC Managed Care – PPO | Admitting: Psychiatry

## 2021-10-03 ENCOUNTER — Encounter: Payer: Self-pay | Admitting: Psychiatry

## 2021-10-03 VITALS — BP 118/81 | HR 78 | Ht 65.5 in | Wt 203.0 lb

## 2021-10-03 DIAGNOSIS — F0781 Postconcussional syndrome: Secondary | ICD-10-CM | POA: Diagnosis not present

## 2021-10-03 DIAGNOSIS — R519 Headache, unspecified: Secondary | ICD-10-CM

## 2021-10-03 MED ORDER — RIZATRIPTAN BENZOATE 10 MG PO TABS: 10.0000 mg | ORAL_TABLET | ORAL | 3 refills | Status: DC | PRN

## 2021-10-03 NOTE — Patient Instructions (Addendum)
Take amitriptyline 25 mg every night at bedtime Take magnesium oxide 400-500 mg daily for headache prevention Start Maxalt as needed for migraines. Take at the onset of migraine. If headache recurs or does not fully resolve, you may take a second dose after 2 hours. Please avoid taking more than 2 days per week to avoid rebound headaches
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
## 2021-10-16 DIAGNOSIS — Z6832 Body mass index (BMI) 32.0-32.9, adult: Secondary | ICD-10-CM | POA: Diagnosis not present

## 2021-10-16 DIAGNOSIS — M542 Cervicalgia: Secondary | ICD-10-CM | POA: Diagnosis not present

## 2021-10-30 ENCOUNTER — Encounter: Payer: Self-pay | Admitting: Psychiatry

## 2021-10-30 ENCOUNTER — Telehealth: Payer: Self-pay | Admitting: *Deleted

## 2021-10-30 NOTE — Telephone Encounter (Signed)
Patient has messaged asking for work accommodations for 3 months. Forms printed, placed on Dr Quentin Mulling desk to review. I advised patient there is $50 fee if forms are completed.

## 2021-10-31 DIAGNOSIS — Z0289 Encounter for other administrative examinations: Secondary | ICD-10-CM

## 2021-10-31 NOTE — Telephone Encounter (Signed)
Work accomodation forms completed by Dr Delena Bali. Patient has paid fee per Angie A. Forms sent to medical records for processing.

## 2021-11-04 ENCOUNTER — Other Ambulatory Visit: Payer: Self-pay | Admitting: Sports Medicine

## 2021-11-16 ENCOUNTER — Ambulatory Visit (INDEPENDENT_AMBULATORY_CARE_PROVIDER_SITE_OTHER): Payer: BC Managed Care – PPO | Admitting: Sports Medicine

## 2021-11-16 ENCOUNTER — Other Ambulatory Visit: Payer: Self-pay

## 2021-11-16 DIAGNOSIS — M79671 Pain in right foot: Secondary | ICD-10-CM

## 2021-11-16 DIAGNOSIS — B359 Dermatophytosis, unspecified: Secondary | ICD-10-CM

## 2021-11-16 DIAGNOSIS — B351 Tinea unguium: Secondary | ICD-10-CM

## 2021-11-16 DIAGNOSIS — M79672 Pain in left foot: Secondary | ICD-10-CM | POA: Diagnosis not present

## 2021-11-16 NOTE — Patient Instructions (Addendum)
Vinegar soaks ?1 cup of white distilled vinegar to 8 cups of warm water.  Soak 20 mins. May repeat soak two times per week. ? ?
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
## 2022-01-09 ENCOUNTER — Ambulatory Visit (INDEPENDENT_AMBULATORY_CARE_PROVIDER_SITE_OTHER): Payer: BC Managed Care – PPO | Admitting: Psychiatry

## 2022-01-09 ENCOUNTER — Encounter: Payer: Self-pay | Admitting: Psychiatry

## 2022-01-09 VITALS — BP 113/71 | HR 83 | Ht 65.0 in | Wt 200.0 lb

## 2022-01-09 DIAGNOSIS — R519 Headache, unspecified: Secondary | ICD-10-CM | POA: Diagnosis not present

## 2022-01-09 DIAGNOSIS — F0781 Postconcussional syndrome: Secondary | ICD-10-CM | POA: Diagnosis not present

## 2022-01-09 NOTE — Patient Instructions (Signed)
Natural supplements that can reduce migraines: Magnesium Oxide or Magnesium Glycinate 500 mg at bed (up to 800 mg daily) Coenzyme Q10 300 mg in AM Vitamin B2- 200 mg twice a day  Add 1 supplement at a time since even natural supplements can have undesirable side effects. You can sometimes buy supplements cheaper (especially Coenzyme Q10) at www.puritan.com or at [employer].  Magnesium: Magnesium (250 mg twice a day or 500 mg at bed) has a relaxant effect on smooth muscles such as blood vessels. Individuals suffering from frequent or daily headache usually have low magnesium levels which can be increase with daily supplementation of 400-800 mg. Three trials found 40-90% average headache reduction  when used as a preventative. Magnesium also demonstrated the benefit in menstrually related migraine.  Magnesium is part of the messenger system in the serotonin cascade and it is a good muscle relaxant.  It is also useful for constipation. Good sources include nuts, whole grains, and tomatoes. Side Effects: loose stool/diarrhea  Riboflavin (vitamin B 2) 200 mg twice a day. This vitamin assists nerve cells in the production of ATP a principal energy storing molecule.  It is necessary for many chemical reactions in the body.  There have been at least 3 clinical trials of riboflavin using 400 mg per day all of which suggested that migraine frequency can be decreased.  All 3 trials showed significant improvement in over half of migraine sufferers.  The supplement is found in bread, cereal, milk, meat, and poultry.  Most Americans get more riboflavin than the recommended daily allowance, however riboflavin deficiency is not necessary for the supplements to help prevent headache. Side effects: energizing, green urine  Coenzyme Q10: This is present in almost all cells in the body and is critical component for the conversion of energy.  Recent studies have shown that a nutritional supplement of CoQ10 can reduce the  frequency of migraine attacks by improving the energy production of cells as with riboflavin.  Doses of 150 mg twice a day have been shown to be effective.  
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
## 2022-04-06 ENCOUNTER — Ambulatory Visit (INDEPENDENT_AMBULATORY_CARE_PROVIDER_SITE_OTHER): Payer: BC Managed Care – PPO | Admitting: Internal Medicine

## 2022-04-06 ENCOUNTER — Encounter: Payer: Self-pay | Admitting: Internal Medicine

## 2022-04-06 VITALS — BP 126/68 | Temp 98.2°F | Ht 65.0 in | Wt 199.0 lb

## 2022-04-06 DIAGNOSIS — F0781 Postconcussional syndrome: Secondary | ICD-10-CM

## 2022-04-06 DIAGNOSIS — M79671 Pain in right foot: Secondary | ICD-10-CM

## 2022-04-06 DIAGNOSIS — Z23 Encounter for immunization: Secondary | ICD-10-CM

## 2022-04-06 DIAGNOSIS — Z7689 Persons encountering health services in other specified circumstances: Secondary | ICD-10-CM | POA: Diagnosis not present

## 2022-04-06 DIAGNOSIS — Z1231 Encounter for screening mammogram for malignant neoplasm of breast: Secondary | ICD-10-CM
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
## 2022-04-06 NOTE — Patient Instructions (Addendum)
Vitamin B12 Deficiency Vitamin B12 deficiency occurs when the body does not have enough of this important vitamin. The body needs this vitamin: To make red blood cells. To make DNA. This is the genetic material inside cells. To help the nerves work properly so they can carry messages from the brain to the body. Vitamin B12 deficiency can cause health problems, such as not having enough red blood cells in the blood (anemia). This can lead to nerve damage if untreated. What are the causes? This condition may be caused by: Not eating enough foods that contain vitamin B12. Not having enough stomach acid and digestive fluids to properly absorb vitamin B12 from the food that you eat. Having certain diseases that make it hard to absorb vitamin B12. These diseases include Crohn's disease, chronic pancreatitis, and cystic fibrosis. An autoimmune disorder in which the body does not make enough of a protein (intrinsic factor) within the stomach, resulting in not enough absorption of vitamin B12. Having a surgery in which part of the stomach or small intestine is removed. Taking certain medicines that make it hard for the body to absorb vitamin B12. These include: Heartburn medicines, such as antacids and proton pump inhibitors. Some medicines that are used to treat diabetes. What increases the risk? The following factors may make you more likely to develop a vitamin B12 deficiency: Being an older adult. Eating a vegetarian or vegan diet that does not include any foods that come from animals. Eating a poor diet while you are pregnant. Taking certain medicines. Having alcoholism. What are the signs or symptoms? In some cases, there are no symptoms of this condition. If the condition leads to anemia or nerve damage, various symptoms may occur, such as: Weakness. Tiredness (fatigue). Loss of appetite. Numbness or tingling in your hands and feet. Redness and burning of the tongue. Depression,  confusion, or memory problems. Trouble walking. If anemia is severe, symptoms can include: Shortness of breath. Dizziness. Rapid heart rate. How is this diagnosed? This condition may be diagnosed with a blood test to measure the level of vitamin B12 in your blood. You may also have other tests, including: A group of tests that measure certain characteristics of blood cells (complete blood count, CBC). A blood test to measure intrinsic factor. A procedure where a thin tube with a camera on the end is used to look into your stomach or intestines (endoscopy). Other tests may be needed to discover the cause of the deficiency. How is this treated? Treatment for this condition depends on the cause. This condition may be treated by: Changing your eating and drinking habits, such as: Eating more foods that contain vitamin B12. Drinking less alcohol or no alcohol. Getting vitamin B12 injections. Taking vitamin B12 supplements by mouth (orally). Your health care provider will tell you which dose is best for you. Follow these instructions at home: Eating and drinking  Include foods in your diet that come from animals and contain a lot of vitamin B12. These include: Meats and poultry. This includes beef, pork, chicken, turkey, and organ meats, such as liver. Seafood. This includes clams, rainbow trout, salmon, tuna, and haddock. Eggs. Dairy foods such as milk, yogurt, and cheese. Eat foods that have vitamin B12 added to them (are fortified), such as ready-to-eat breakfast cereals. Check the label on the package to see if a food is fortified. The items listed above may not be a complete list of foods and beverages you can eat and drink. Contact a dietitian for   more information. Alcohol use Do not drink alcohol if: Your health care provider tells you not to drink. You are pregnant, may be pregnant, or are planning to become pregnant. If you drink alcohol: Limit how much you have to: 0-1 drink a  day for women. 0-2 drinks a day for men. Know how much alcohol is in your drink. In the [place] one drink equals one 12 oz bottle of beer (355 mL), one 5 oz glass of wine (148 mL), or one 1 oz glass of hard liquor (44 mL). General instructions Get vitamin B12 injections if told to by your health care provider. Take supplements only as told by your health care provider. Follow the directions carefully. Keep all follow-up visits. This is important. Contact a health care provider if: Your symptoms come back. Your symptoms get worse or do not improve with treatment. Get help right away: You develop shortness of breath. You have a rapid heart rate. You have chest pain. You become dizzy or you faint. These symptoms may be an emergency. Get help right away. Call 911. Do not wait to see if the symptoms will go away. Do not drive yourself to the hospital. Summary Vitamin B12 deficiency occurs when the body does not have enough of this important vitamin. Common causes include not eating enough foods that contain vitamin B12, not being able to absorb vitamin B12 from the food that you eat, having a surgery in which part of the stomach or small intestine is removed, or taking certain medicines. Eat foods that have vitamin B12 in them. Treatment may include making a change in the way you eat and drink, getting vitamin B12 injections, or taking vitamin B12 supplements. This information is not intended to replace advice given to you by your health care provider. Make sure you discuss any questions you have with your health care provider. Document Revised: 04/14/2021 Document Reviewed: 04/14/2021 Elsevier Patient Education  2023 Elsevier Inc.  

## 2022-04-09 ENCOUNTER — Other Ambulatory Visit: Payer: Self-pay

## 2022-04-09 DIAGNOSIS — R5382 Chronic fatigue, unspecified: Secondary | ICD-10-CM | POA: Diagnosis not present

## 2022-04-09 DIAGNOSIS — F0781 Postconcussional syndrome: Secondary | ICD-10-CM | POA: Diagnosis not present

## 2022-04-09 DIAGNOSIS — Z79899 Other long term (current) drug therapy: Secondary | ICD-10-CM | POA: Diagnosis not present

## 2022-04-10 LAB — CMP14+EGFR
ALT: 14 IU/L (ref 0–32)
AST: 20 IU/L (ref 0–40)
Albumin/Globulin Ratio: 1.8 (ref 1.2–2.2)
Albumin: 4.5 g/dL (ref 3.8–4.9)
Alkaline Phosphatase: 68 IU/L (ref 44–121)
BUN/Creatinine Ratio: 13 (ref 9–23)
BUN: 11 mg/dL (ref 6–24)
Bilirubin Total: 0.6 mg/dL (ref 0.0–1.2)
CO2: 25 mmol/L (ref 20–29)
Calcium: 9.5 mg/dL (ref 8.7–10.2)
Chloride: 103 mmol/L (ref 96–106)
Creatinine, Ser: 0.88 mg/dL (ref 0.57–1.00)
Globulin, Total: 2.5 g/dL (ref 1.5–4.5)
Glucose: 78 mg/dL (ref 70–99)
Potassium: 4 mmol/L (ref 3.5–5.2)
Sodium: 141 mmol/L (ref 134–144)
Total Protein: 7 g/dL (ref 6.0–8.5)
eGFR: 76 mL/min/{1.73_m2} (ref >59)

## 2022-04-10 LAB — CBC
Hematocrit: 40.8 % (ref 34.0–46.6)
Hemoglobin: 13.9 g/dL (ref 11.1–15.9)
MCH: 32.5 pg (ref 26.6–33.0)
MCHC: 34.1 g/dL (ref 31.5–35.7)
MCV: 95 fL (ref 79–97)
Platelets: 221 10*3/uL (ref 150–450)
RBC: 4.28 x10E6/uL (ref 3.77–5.28)
RDW: 11.8 % (ref 11.7–15.4)
WBC: 5.5 10*3/uL (ref 3.4–10.8)

## 2022-04-10 LAB — VITAMIN B12: Vitamin B-12: 815 pg/mL (ref 232–1245)

## 2022-04-10 LAB — TSH: TSH: 0.643 u[IU]/mL (ref 0.450–4.500)
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
## 2022-04-23 LAB — CMP14+EGFR
ALT: 15 IU/L (ref 0–32)
AST: 18 IU/L (ref 0–40)
Albumin/Globulin Ratio: 1.7 (ref 1.2–2.2)
Albumin: 4.6 g/dL (ref 3.8–4.9)
Alkaline Phosphatase: 72 IU/L (ref 44–121)
BUN/Creatinine Ratio: 10 (ref 9–23)
BUN: 8 mg/dL (ref 6–24)
Bilirubin Total: 0.7 mg/dL (ref 0.0–1.2)
CO2: 22 mmol/L (ref 20–29)
Calcium: 10 mg/dL (ref 8.7–10.2)
Chloride: 103 mmol/L (ref 96–106)
Creatinine, Ser: 0.77 mg/dL (ref 0.57–1.00)
Globulin, Total: 2.7 g/dL (ref 1.5–4.5)
Glucose: 83 mg/dL (ref 70–99)
Potassium: 4.6 mmol/L (ref 3.5–5.2)
Sodium: 144 mmol/L (ref 134–144)
Total Protein: 7.3 g/dL (ref 6.0–8.5)
eGFR: 89 mL/min/{1.73_m2} (ref >59)

## 2022-04-23 LAB — CBC
Hematocrit: 45.9 % (ref 34.0–46.6)
Hemoglobin: 14.4 g/dL (ref 11.1–15.9)
MCH: 32.3 pg (ref 26.6–33.0)
MCHC: 31.4 g/dL — ABNORMAL LOW (ref 31.5–35.7)
MCV: 103 fL — ABNORMAL HIGH (ref 79–97)
Platelets: 224 10*3/uL (ref 150–450)
RBC: 4.46 x10E6/uL (ref 3.77–5.28)
RDW: 13.3 % (ref 11.7–15.4)
WBC: 5.5 10*3/uL (ref 3.4–10.8)

## 2022-04-23 LAB — TSH: TSH: 0.688 u[IU]/mL (ref 0.450–4.500)

## 2022-04-23 LAB — VITAMIN B12: Vitamin B-12: 819 pg/mL (ref 232–1245)

## 2022-04-27 ENCOUNTER — Ambulatory Visit
Admission: RE | Admit: 2022-04-27 | Discharge: 2022-04-27 | Disposition: A | Discharge status: Home / Self Care | Payer: BC Managed Care – PPO | Source: Ambulatory Visit | Attending: Internal Medicine | Admitting: Internal Medicine

## 2022-04-27 DIAGNOSIS — Z1231 Encounter for screening mammogram for malignant neoplasm of breast: Secondary | ICD-10-CM | POA: Diagnosis not present

## 2022-05-08 ENCOUNTER — Encounter: Payer: Self-pay | Admitting: Internal Medicine

## 2022-05-29 ENCOUNTER — Ambulatory Visit: Payer: BC Managed Care – PPO | Admitting: Internal Medicine

## 2022-06-13 ENCOUNTER — Ambulatory Visit: Payer: BC Managed Care – PPO | Admitting: Internal Medicine

## 2022-06-21 ENCOUNTER — Ambulatory Visit
Admission: EM | Admit: 2022-06-21 | Discharge: 2022-06-21 | Disposition: A | Discharge status: Home / Self Care | Payer: BC Managed Care – PPO

## 2022-06-21 ENCOUNTER — Encounter: Payer: Self-pay | Admitting: Internal Medicine

## 2022-06-21 DIAGNOSIS — J4530 Mild persistent asthma, uncomplicated: Secondary | ICD-10-CM | POA: Diagnosis not present

## 2022-06-21 DIAGNOSIS — R058 Other specified cough: Secondary | ICD-10-CM

## 2022-06-21 MED ORDER — PROMETHAZINE-DM 6.25-15 MG/5ML PO SYRP: 2.5000 mL | ORAL_SOLUTION | Freq: Three times a day (TID) | ORAL | 0 refills | Status: DC | PRN

## 2022-06-21 MED ORDER — ALBUTEROL SULFATE HFA 108 (90 BASE) MCG/ACT IN AERS: 1.0000 | INHALATION_SPRAY | Freq: Four times a day (QID) | RESPIRATORY_TRACT | 0 refills | Status: DC | PRN

## 2022-06-21 MED ORDER — PREDNISONE 20 MG PO TABS: ORAL_TABLET | ORAL | 0 refills | Status: DC
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
## 2022-06-21 NOTE — ED Triage Notes (Signed)
Pt presents with c/o asthma flare ups at HS x 3 nights

## 2022-06-26 ENCOUNTER — Encounter: Payer: Self-pay | Admitting: Internal Medicine

## 2022-06-26 ENCOUNTER — Telehealth (INDEPENDENT_AMBULATORY_CARE_PROVIDER_SITE_OTHER): Payer: BC Managed Care – PPO | Admitting: Internal Medicine

## 2022-06-26 VITALS — Ht 65.0 in | Wt 195.0 lb

## 2022-06-26 DIAGNOSIS — J4520 Mild intermittent asthma, uncomplicated: Secondary | ICD-10-CM

## 2022-06-26 DIAGNOSIS — R0982 Postnasal drip: Secondary | ICD-10-CM

## 2022-06-26 MED ORDER — ALBUTEROL SULFATE HFA 108 (90 BASE) MCG/ACT IN AERS: 1.0000 | INHALATION_SPRAY | Freq: Four times a day (QID) | RESPIRATORY_TRACT | 3 refills | Status: DC | PRN

## 2022-06-26 MED ORDER — MONTELUKAST SODIUM 10 MG PO TABS: 10.0000 mg | ORAL_TABLET | Freq: Every day | ORAL | 2 refills | Status: DC

## 2022-06-26 MED ORDER — ALBUTEROL SULFATE HFA 108 (90 BASE) MCG/ACT IN AERS: 1.0000 | INHALATION_SPRAY | Freq: Four times a day (QID) | RESPIRATORY_TRACT | 11 refills | Status: DC | PRN

## 2022-06-26 NOTE — Patient Instructions (Signed)
Asthma, Adult ? ?Asthma is a long-term (chronic) condition that causes recurrent episodes in which the lower airways in the lungs become tight and narrow. The narrowing is caused by inflammation and tightening of the smooth muscle around the lower airways. ?Asthma episodes, also called asthma attacks or asthma flares, may cause coughing, making high-pitched whistling sounds when you breathe, most often when you breathe out (wheezing), shortness of breath, and chest pain. The airways may produce extra mucus caused by the inflammation and irritation. During an attack, it can be difficult to breathe. Asthma attacks can range from minor to life-threatening. ?Asthma cannot be cured, but medicines and lifestyle changes can help control it and treat acute attacks. It is important to keep your asthma well controlled so the condition does not interfere with your daily life. ?What are the causes? ?This condition is believed to be caused by inherited (genetic) and environmental factors, but its exact cause is not known. ?What can trigger an asthma attack? ?Many things can bring on an asthma attack or make symptoms worse. These triggers are different for every person. Common triggers include: ?Allergens and irritants like mold, dust, pet dander, cockroaches, pollen, air pollution, and chemical odors. ?Cigarette smoke. ?Weather changes and cold air. ?Stress and strong emotional responses such as crying or laughing hard. ?Certain medications such as aspirin or beta blockers. ?Infections and inflammatory conditions, such as the flu, a cold, pneumonia, or inflammation of the nasal membranes (rhinitis). ?Gastroesophageal reflux disease (GERD). ?What are the signs or symptoms? ?Symptoms may occur right after exposure to an asthma trigger or hours later and can vary by person. Common signs and symptoms include: ?Wheezing. ?Trouble breathing (shortness of breath). ?Excessive nighttime or early morning coughing. ?Chest  tightness. ?Tiredness (fatigue) with minimal activity. ?Difficulty talking in complete sentences. ?Poor exercise tolerance. ?How is this diagnosed? ?This condition is diagnosed based on: ?A physical exam and your medical history. ?Tests, which may include: ?Lung function studies to evaluate the flow of air in your lungs. ?Allergy tests. ?Imaging tests, such as X-rays. ?How is this treated? ?There is no cure, but symptoms can be controlled with proper treatment. Treatment usually involves: ?Identifying and avoiding your asthma triggers. ?Inhaled medicines. Two types are commonly used to treat asthma, depending on severity: ?Controller medicines. These help prevent asthma symptoms from occurring. They are taken every day. ?Fast-acting reliever or rescue medicines. These quickly relieve asthma symptoms. They are used as needed and provide short-term relief. ?Using other medicines, such as: ?Allergy medicines, such as antihistamines, if your asthma attacks are triggered by allergens. ?Immune medicines (immunomodulators). These are medicines that help control the immune system. ?Using supplemental oxygen. This is only needed during a severe episode. ?Creating an asthma action plan. An asthma action plan is a written plan for managing and treating your asthma attacks. This plan includes: ?A list of your asthma triggers and how to avoid them. ?Information about when medicines should be taken and when their dosage should be changed. ?Instructions about using a device called a peak flow meter. A peak flow meter measures how well the lungs are working and the severity of your asthma. It helps you monitor your condition. ?Follow these instructions at home: ?Take over-the-counter and prescription medicines only as told by your health care provider. ?Stay up to date on all vaccinations as recommended by your healthcare provider, including vaccines for the flu and pneumonia. ?Use a peak flow meter and keep track of your peak flow  readings. ?Understand and use your asthma   action plan to address any asthma flares. ?Do not smoke or allow anyone to smoke in your home. ?Contact a health care provider if: ?You have wheezing, shortness of breath, or a cough that is not responding to medicines. ?Your medicines are causing side effects, such as a rash, itching, swelling, or trouble breathing. ?You need to use a reliever medicine more than 2-3 times a week. ?Your peak flow reading is still at 50-79% of your personal best after following your action plan for 1 hour. ?You have a fever and shortness of breath. ?Get help right away if: ?You are getting worse and do not respond to treatment during an asthma attack. ?You are short of breath when at rest or when doing very little physical activity. ?You have difficulty eating, drinking, or talking. ?You have chest pain or tightness. ?You develop a fast heartbeat or palpitations. ?You have a bluish color to your lips or fingernails. ?You are light-headed or dizzy, or you faint. ?Your peak flow reading is less than 50% of your personal best. ?You feel too tired to breathe normally. ?These symptoms may be an emergency. Get help right away. Call 911. ?Do not wait to see if the symptoms will go away. ?Do not drive yourself to the hospital. ?Summary ?Asthma is a long-term (chronic) condition that causes recurrent episodes in which the airways become tight and narrow. Asthma episodes, also called asthma attacks or asthma flares, can cause coughing, wheezing, shortness of breath, and chest pain. ?Asthma cannot be cured, but medicines and lifestyle changes can help keep it well controlled and prevent asthma flares. ?Make sure you understand how to avoid triggers and how and when to use your medicines. ?Asthma attacks can range from minor to life-threatening. Get help right away if you have an asthma attack and do not respond to treatment with your usual rescue medicines. ?This information is not intended to replace  advice given to you by your health care provider. Make sure you discuss any questions you have with your health care provider. ?Document Revised: 06/07/2021 Document Reviewed: 05/29/2021 ?Elsevier Patient Education ? 2023 [employer] ? ?
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
## 2022-07-05 ENCOUNTER — Telehealth: Payer: Self-pay | Admitting: Psychiatry

## 2022-07-05 NOTE — Telephone Encounter (Signed)
Sent mychart msg informing pt of appointment change from 01/10/23 to 12/13/22 - MD out

## 2022-07-10 ENCOUNTER — Ambulatory Visit: Payer: BC Managed Care – PPO

## 2022-07-10 ENCOUNTER — Ambulatory Visit (INDEPENDENT_AMBULATORY_CARE_PROVIDER_SITE_OTHER): Payer: BC Managed Care – PPO

## 2022-07-10 ENCOUNTER — Other Ambulatory Visit: Payer: Self-pay | Admitting: Internal Medicine

## 2022-07-10 VITALS — BP 128/74 | HR 94 | Temp 98.3°F

## 2022-07-10 DIAGNOSIS — Z23 Encounter for immunization: Secondary | ICD-10-CM

## 2022-07-10 NOTE — Patient Instructions (Signed)

## 2022-07-10 NOTE — Progress Notes (Signed)
Patient presents today for 2nd Shingles Vaccine.

## 2022-08-22 ENCOUNTER — Ambulatory Visit: Payer: BC Managed Care – PPO | Admitting: Internal Medicine

## 2022-09-05 ENCOUNTER — Encounter: Payer: Self-pay | Admitting: Internal Medicine

## 2022-09-05 ENCOUNTER — Ambulatory Visit (INDEPENDENT_AMBULATORY_CARE_PROVIDER_SITE_OTHER): Payer: BC Managed Care – PPO | Admitting: Internal Medicine

## 2022-09-05 VITALS — BP 122/76 | HR 87 | Temp 98.7°F | Ht 66.2 in | Wt 200.0 lb

## 2022-09-05 DIAGNOSIS — Z6832 Body mass index (BMI) 32.0-32.9, adult: Secondary | ICD-10-CM

## 2022-09-05 DIAGNOSIS — J4520 Mild intermittent asthma, uncomplicated: Secondary | ICD-10-CM

## 2022-09-05 DIAGNOSIS — E6609 Other obesity due to excess calories: Secondary | ICD-10-CM

## 2022-09-05 DIAGNOSIS — R413 Other amnesia: Secondary | ICD-10-CM

## 2022-09-05 DIAGNOSIS — Z23 Encounter for immunization: Secondary | ICD-10-CM | POA: Diagnosis not present

## 2022-09-05 DIAGNOSIS — F0781 Postconcussional syndrome: Secondary | ICD-10-CM
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
## 2022-09-05 NOTE — Patient Instructions (Signed)
Magnesium threonate -- brain function  Magnesium glycinate - for sleep

## 2022-09-25 ENCOUNTER — Encounter: Payer: Self-pay | Admitting: Internal Medicine

## 2022-09-26 ENCOUNTER — Institutional Professional Consult (permissible substitution): Payer: BC Managed Care – PPO | Admitting: Student

## 2022-10-09 ENCOUNTER — Ambulatory Visit: Payer: BC Managed Care – PPO | Admitting: Internal Medicine

## 2022-10-11 ENCOUNTER — Encounter: Payer: Self-pay | Admitting: Pulmonary Disease

## 2022-10-11 ENCOUNTER — Ambulatory Visit (INDEPENDENT_AMBULATORY_CARE_PROVIDER_SITE_OTHER): Payer: BC Managed Care – PPO | Admitting: Pulmonary Disease

## 2022-10-11 ENCOUNTER — Ambulatory Visit (INDEPENDENT_AMBULATORY_CARE_PROVIDER_SITE_OTHER): Payer: BC Managed Care – PPO

## 2022-10-11 VITALS — BP 128/88 | HR 91 | Temp 97.8°F | Ht 65.0 in | Wt 202.8 lb

## 2022-10-11 DIAGNOSIS — J309 Allergic rhinitis, unspecified: Secondary | ICD-10-CM

## 2022-10-11 DIAGNOSIS — J45909 Unspecified asthma, uncomplicated: Secondary | ICD-10-CM

## 2022-10-11 DIAGNOSIS — R062 Wheezing: Secondary | ICD-10-CM | POA: Diagnosis not present

## 2022-10-11 DIAGNOSIS — K219 Gastro-esophageal reflux disease without esophagitis: Secondary | ICD-10-CM | POA: Diagnosis not present

## 2022-10-11 LAB — POCT EXHALED NITRIC OXIDE: FeNO level (ppb): 48
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
## 2022-10-11 NOTE — Patient Instructions (Signed)
Nocturnal wheezing: We will check for asthma: full pulmonary function test  Mild intermittent asthma: Use albuterol as needed for chest tightness wheezing or shortness of breath Workup as above Treat allergic rhinitis and GERD as below  GERD: Avoid fatty foods, alcohol, chocolate, caffeine and tobacco products No eating within 3 hours of bedtime If no improvement try OTC pepcid twice daily  Allergic rhinitis: Use Neil Med rinses with distilled water at least twice per day using the instructions on the package. 1/2 hour after using the [facility] Med rinse, use Nasacort two puffs in each nostril once per day.  Remember that the Nasacort can take 1-2 weeks to work after regular use. Use generic claritin every day.  If this doesn't help, then stop taking it and use chlorpheniramine-phenylephrine combination tablets.  Follow-up 4 to 6 weeks, sooner if needed

## 2022-11-15 ENCOUNTER — Ambulatory Visit: Payer: BC Managed Care – PPO | Admitting: Pulmonary Disease

## 2022-11-19 ENCOUNTER — Other Ambulatory Visit: Payer: Self-pay | Admitting: *Deleted

## 2022-11-19 MED ORDER — AMITRIPTYLINE HCL 25 MG PO TABS: 25.0000 mg | ORAL_TABLET | Freq: Every day | ORAL | 0 refills | Status: DC

## 2022-11-19 NOTE — Telephone Encounter (Signed)
Last seen on 01/09/2022 Follow up visit scheduled on 12/13/2022

## 2022-12-13 ENCOUNTER — Ambulatory Visit: Payer: BC Managed Care – PPO | Admitting: Psychiatry

## 2023-01-10 ENCOUNTER — Ambulatory Visit: Payer: BC Managed Care – PPO | Admitting: Psychiatry

## 2023-01-24 ENCOUNTER — Encounter: Payer: BC Managed Care – PPO | Admitting: Internal Medicine

## 2023-02-28 DIAGNOSIS — Z113 Encounter for screening for infections with a predominantly sexual mode of transmission: Secondary | ICD-10-CM | POA: Diagnosis not present

## 2023-02-28 DIAGNOSIS — Z124 Encounter for screening for malignant neoplasm of cervix: Secondary | ICD-10-CM | POA: Diagnosis not present

## 2023-02-28 DIAGNOSIS — Z01419 Encounter for gynecological examination (general) (routine) without abnormal findings: Secondary | ICD-10-CM | POA: Diagnosis not present

## 2023-03-12 LAB — HM PAP SMEAR

## 2023-06-10 ENCOUNTER — Other Ambulatory Visit: Payer: Self-pay | Admitting: Internal Medicine

## 2023-06-10 DIAGNOSIS — Z1231 Encounter for screening mammogram for malignant neoplasm of breast: Secondary | ICD-10-CM

## 2023-07-05 ENCOUNTER — Ambulatory Visit: Payer: Self-pay

## 2023-08-08 ENCOUNTER — Ambulatory Visit
Admission: RE | Admit: 2023-08-08 | Discharge: 2023-08-08 | Disposition: A | Discharge status: Home / Self Care | Payer: BC Managed Care – PPO | Source: Ambulatory Visit | Attending: Internal Medicine | Admitting: Internal Medicine

## 2023-08-08 ENCOUNTER — Ambulatory Visit: Payer: BC Managed Care – PPO

## 2023-08-08 DIAGNOSIS — Z1231 Encounter for screening mammogram for malignant neoplasm of breast: Secondary | ICD-10-CM

## 2023-09-03 ENCOUNTER — Other Ambulatory Visit: Payer: Self-pay | Admitting: Internal Medicine

## 2023-11-28 ENCOUNTER — Encounter: Payer: Self-pay | Admitting: Internal Medicine

## 2023-11-28 ENCOUNTER — Ambulatory Visit: Payer: Self-pay | Admitting: Internal Medicine

## 2023-11-28 VITALS — BP 110/78 | HR 81 | Temp 97.6°F | Ht 65.0 in | Wt 208.2 lb

## 2023-11-28 DIAGNOSIS — J4520 Mild intermittent asthma, uncomplicated: Secondary | ICD-10-CM

## 2023-11-28 DIAGNOSIS — E66811 Obesity, class 1: Secondary | ICD-10-CM

## 2023-11-28 DIAGNOSIS — Z6834 Body mass index (BMI) 34.0-34.9, adult: Secondary | ICD-10-CM

## 2023-11-28 DIAGNOSIS — E6609 Other obesity due to excess calories: Secondary | ICD-10-CM | POA: Diagnosis not present

## 2023-11-28 DIAGNOSIS — Z8249 Family history of ischemic heart disease and other diseases of the circulatory system: Secondary | ICD-10-CM

## 2023-11-28 DIAGNOSIS — Z0000 Encounter for general adult medical examination without abnormal findings: Secondary | ICD-10-CM | POA: Diagnosis not present

## 2023-11-28 DIAGNOSIS — F0781 Postconcussional syndrome: Secondary | ICD-10-CM
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
## 2023-11-28 NOTE — Patient Instructions (Signed)

## 2023-11-29 LAB — CBC
Hematocrit: 41.9 % (ref 34.0–46.6)
Hemoglobin: 13.6 g/dL (ref 11.1–15.9)
MCH: 31.6 pg (ref 26.6–33.0)
MCHC: 32.5 g/dL (ref 31.5–35.7)
MCV: 97 fL (ref 79–97)
Platelets: 187 10*3/uL (ref 150–450)
RBC: 4.31 x10E6/uL (ref 3.77–5.28)
RDW: 11.7 % (ref 11.7–15.4)
WBC: 5.1 10*3/uL (ref 3.4–10.8)

## 2023-11-29 LAB — CMP14+EGFR
ALT: 13 IU/L (ref 0–32)
AST: 18 IU/L (ref 0–40)
Albumin: 4.5 g/dL (ref 3.8–4.9)
Alkaline Phosphatase: 78 IU/L (ref 44–121)
BUN/Creatinine Ratio: 15 (ref 12–28)
BUN: 13 mg/dL (ref 8–27)
Bilirubin Total: 0.6 mg/dL (ref 0.0–1.2)
CO2: 25 mmol/L (ref 20–29)
Calcium: 9.1 mg/dL (ref 8.7–10.3)
Chloride: 104 mmol/L (ref 96–106)
Creatinine, Ser: 0.89 mg/dL (ref 0.57–1.00)
Globulin, Total: 2.2 g/dL (ref 1.5–4.5)
Glucose: 98 mg/dL (ref 70–99)
Potassium: 4.8 mmol/L (ref 3.5–5.2)
Sodium: 142 mmol/L (ref 134–144)
Total Protein: 6.7 g/dL (ref 6.0–8.5)
eGFR: 74 mL/min/{1.73_m2} (ref >59)

## 2023-11-29 LAB — LIPID PANEL
Chol/HDL Ratio: 2.2 ratio (ref 0.0–4.4)
Cholesterol, Total: 165 mg/dL (ref 100–199)
HDL: 75 mg/dL (ref >39)
LDL Chol Calc (NIH): 79 mg/dL (ref 0–99)
Triglycerides: 56 mg/dL (ref 0–149)
VLDL Cholesterol Cal: 11 mg/dL (ref 5–40)

## 2023-11-29 LAB — TSH: TSH: 0.389 u[IU]/mL — ABNORMAL LOW (ref 0.450–4.500)

## 2023-11-29 LAB — LIPOPROTEIN A (LPA): Lipoprotein (a): 18.8 nmol/L (ref <75.0)

## 2023-12-02 ENCOUNTER — Encounter: Payer: Self-pay | Admitting: Internal Medicine

## 2023-12-05 LAB — SPECIMEN STATUS REPORT

## 2023-12-05 LAB — T4, FREE: Free T4: 1.24 ng/dL (ref 0.82–1.77)

## 2023-12-07 DIAGNOSIS — E66811 Obesity, class 1: Secondary | ICD-10-CM | POA: Insufficient documentation

## 2023-12-07 DIAGNOSIS — Z0000 Encounter for general adult medical examination without abnormal findings: Secondary | ICD-10-CM | POA: Insufficient documentation

## 2023-12-07 NOTE — Assessment & Plan Note (Signed)

## 2023-12-07 NOTE — Assessment & Plan Note (Signed)
 Chronic, sx are currently stable. She was seen in Pulmonary in 2024, notes reviewed. She uses albuterol prn.

## 2023-12-07 NOTE — Assessment & Plan Note (Signed)
 She is encouraged to strive for BMI less than 30 to decrease cardiac risk. Advised to aim for at least 150 minutes of exercise per week.

## 2023-12-21 ENCOUNTER — Encounter: Payer: Self-pay | Admitting: Internal Medicine

## 2024-02-01 ENCOUNTER — Other Ambulatory Visit: Payer: Self-pay

## 2024-02-01 ENCOUNTER — Ambulatory Visit
Admission: EM | Admit: 2024-02-01 | Discharge: 2024-02-01 | Disposition: A | Discharge status: Home / Self Care | Attending: Family Medicine | Admitting: Family Medicine

## 2024-02-01 DIAGNOSIS — J4521 Mild intermittent asthma with (acute) exacerbation: Secondary | ICD-10-CM

## 2024-02-01 DIAGNOSIS — R0602 Shortness of breath: Secondary | ICD-10-CM | POA: Diagnosis not present

## 2024-02-01 DIAGNOSIS — R051 Acute cough: Secondary | ICD-10-CM

## 2024-02-01 DIAGNOSIS — B349 Viral infection, unspecified: Secondary | ICD-10-CM | POA: Diagnosis not present

## 2024-02-01 HISTORY — DX: Unspecified asthma, uncomplicated: J45.909

## 2024-02-01 LAB — POC COVID19/FLU A&B COMBO
Covid Antigen, POC: NEGATIVE
Influenza A Antigen, POC: NEGATIVE
Influenza B Antigen, POC: NEGATIVE

## 2024-02-01 MED ORDER — PREDNISONE 20 MG PO TABS: 40.0000 mg | ORAL_TABLET | Freq: Every day | ORAL | 0 refills | Status: AC

## 2024-02-01 MED ORDER — BENZONATATE 200 MG PO CAPS: 200.0000 mg | ORAL_CAPSULE | Freq: Three times a day (TID) | ORAL | 0 refills | Status: DC | PRN

## 2024-02-01 MED ORDER — IPRATROPIUM-ALBUTEROL 0.5-2.5 (3) MG/3ML IN SOLN
3.0000 mL | Freq: Once | RESPIRATORY_TRACT | Status: AC
  Administered 2024-02-01: 3 mL via RESPIRATORY_TRACT

## 2024-02-01 MED ORDER — ALBUTEROL SULFATE HFA 108 (90 BASE) MCG/ACT IN AERS: 1.0000 | INHALATION_SPRAY | Freq: Four times a day (QID) | RESPIRATORY_TRACT | 0 refills | Status: DC | PRN
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
## 2024-02-01 NOTE — Discharge Instructions (Addendum)
 You have tested negative for COVID and flu.  I have refilled your albuterol  inhaler to use as needed for wheezing or shortness of breath.  Start prednisone  daily for 5 days.  You may take Tessalon 3 times a day as needed for your cough.  Lots of rest and fluids.  Please follow-up with your PCP in 2 to 3 days for recheck.  Please go to the ER if you develop any worsening symptoms.  Hope you feel better soon!

## 2024-02-01 NOTE — ED Triage Notes (Signed)
 Pt c/o dry cough, wheezing, fever 100.9 2 days ago and chills. Pt states at night having SOB, HAx3d

## 2024-04-01 ENCOUNTER — Ambulatory Visit: Payer: Self-pay

## 2024-04-01 NOTE — Telephone Encounter (Signed)
 FYI Only or Action Required?: FYI only for provider.  Patient was last seen in primary care on 11/28/2023 by Jarold Medici, MD.  Called Nurse Triage reporting Urinary Frequency.  Symptoms began 5 days ago .  Interventions attempted: Rest, hydration, or home remedies.  Symptoms are: gradually worsening.  Triage Disposition: See Physician Within 24 Hours  Patient/caregiver understands and will follow disposition?: yes      Copied from CRM #[id number]. Topic: Clinical - Red Word Triage >> Apr 01, 2024 12:09 PM Antwanette L wrote: Red Word that prompted transfer to Nurse Triage: Possible uti. Pt is experiencing frequent urination, pain during urination, lower abdominal pain Reason for Disposition  Age > 50 years  Answer Assessment - Initial Assessment Questions 1. SEVERITY: How bad is the pain?  (e.g., Scale 1-10; mild, moderate, or severe)     8/10 2. FREQUENCY: How many times have you had painful urination today?      5 times 3. PATTERN: Is pain present every time you urinate or just sometimes?      Every time 4. ONSET: When did the painful urination start?      5 days ago  5. FEVER: Do you have a fever? If Yes, ask: What is your temperature, how was it measured, and when did it start?     no 6. PAST UTI: Have you had a urine infection before? If Yes, ask: When was the last time? and What happened that time?      Yes 1 month ago  7. CAUSE: What do you think is causing the painful urination?  (e.g., UTI, scratch, Herpes sore)     UTI--after sexual intercourse 8. OTHER SYMPTOMS: Do you have any other symptoms? (e.g., blood in urine, flank pain, genital sores, urgency, vaginal discharge)     Lower abd pain during urination (bladder),  Protocols used: Urination Pain - Female-A-AH

## 2024-04-02 ENCOUNTER — Encounter: Payer: Self-pay | Admitting: Internal Medicine

## 2024-04-02 ENCOUNTER — Ambulatory Visit: Payer: Self-pay | Admitting: Internal Medicine

## 2024-04-02 VITALS — BP 100/70 | HR 103 | Temp 98.8°F | Ht 65.0 in | Wt 205.0 lb

## 2024-04-02 DIAGNOSIS — R000 Tachycardia, unspecified: Secondary | ICD-10-CM | POA: Diagnosis not present

## 2024-04-02 DIAGNOSIS — R3915 Urgency of urination: Secondary | ICD-10-CM

## 2024-04-02 DIAGNOSIS — N3001 Acute cystitis with hematuria: Secondary | ICD-10-CM | POA: Diagnosis not present

## 2024-04-02 DIAGNOSIS — R7989 Other specified abnormal findings of blood chemistry: Secondary | ICD-10-CM

## 2024-04-02 DIAGNOSIS — R0981 Nasal congestion: Secondary | ICD-10-CM | POA: Diagnosis not present

## 2024-04-02 DIAGNOSIS — Z2821 Immunization not carried out because of patient refusal: Secondary | ICD-10-CM

## 2024-04-02 LAB — POCT URINALYSIS DIP (CLINITEK)
Bilirubin, UA: NEGATIVE
Glucose, UA: NEGATIVE mg/dL
Nitrite, UA: POSITIVE — AB
POC PROTEIN,UA: 100 — AB
Spec Grav, UA: 1.02 (ref 1.010–1.025)
Urobilinogen, UA: 0.2 U/dL
pH, UA: 6.5 (ref 5.0–8.0)

## 2024-04-02 MED ORDER — SULFAMETHOXAZOLE-TRIMETHOPRIM 800-160 MG PO TABS: 1.0000 | ORAL_TABLET | Freq: Two times a day (BID) | ORAL | 0 refills | Status: AC
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
## 2024-04-03 LAB — MAGNESIUM: Magnesium: 2 mg/dL (ref 1.6–2.3)

## 2024-04-03 LAB — CBC
Hematocrit: 44.3 % (ref 34.0–46.6)
Hemoglobin: 14.5 g/dL (ref 11.1–15.9)
MCH: 31.7 pg (ref 26.6–33.0)
MCHC: 32.7 g/dL (ref 31.5–35.7)
MCV: 97 fL (ref 79–97)
Platelets: 179 x10E3/uL (ref 150–450)
RBC: 4.58 x10E6/uL (ref 3.77–5.28)
RDW: 12.1 % (ref 11.7–15.4)
WBC: 6.6 x10E3/uL (ref 3.4–10.8)

## 2024-04-03 LAB — TSH+FREE T4
Free T4: 1.01 ng/dL (ref 0.82–1.77)
TSH: 0.957 u[IU]/mL (ref 0.450–4.500)

## 2024-04-05 ENCOUNTER — Encounter: Payer: Self-pay | Admitting: Internal Medicine

## 2024-04-05 ENCOUNTER — Ambulatory Visit: Payer: Self-pay | Admitting: Internal Medicine

## 2024-04-05 DIAGNOSIS — R0981 Nasal congestion: Secondary | ICD-10-CM | POA: Insufficient documentation

## 2024-04-05 DIAGNOSIS — R000 Tachycardia, unspecified: Secondary | ICD-10-CM | POA: Insufficient documentation

## 2024-04-05 DIAGNOSIS — R7989 Other specified abnormal findings of blood chemistry: Secondary | ICD-10-CM | POA: Insufficient documentation

## 2024-04-05 DIAGNOSIS — R3915 Urgency of urination: Secondary | ICD-10-CM | POA: Insufficient documentation

## 2024-04-05 DIAGNOSIS — N3001 Acute cystitis with hematuria: Secondary | ICD-10-CM | POA: Insufficient documentation

## 2024-04-05 LAB — URINE CULTURE

## 2024-04-05 NOTE — Assessment & Plan Note (Signed)
 Sx suggestive of UTI, will check urinalysis.

## 2024-04-05 NOTE — Assessment & Plan Note (Signed)
 Congestion possibly from travel exposure. Discussed dehydration as a factor. COVID-19 testing warranted due to recent high-risk exposure. - Consider ColdCalm for sinus congestion. - Test for COVID-19. - Advise melatonin for three days if COVID-19 positive. - Consider Allegra or Zyrtec for inflammation.

## 2024-04-05 NOTE — Assessment & Plan Note (Signed)
 Tachycardia noted in April. No current palpitations. Possible thyroid  dysfunction. Magnesium levels may influence heart rate. - Check thyroid  function tests. - Check magnesium levels. - Advise reporting new palpitations or flutters.

## 2024-04-05 NOTE — Assessment & Plan Note (Signed)
 Symptoms and urinalysis indicate UTI. Hematuria likely from bladder infection. Discussed post-coital prophylaxis but she prefers to avoid frequent antibiotics. Urology evaluation considered if UTIs persist. - Prescribe Bactrim  for 5 days, twice daily. - Send urine for culture to ensure sensitivity. - Consider urology evaluation if recurrent UTIs persist.

## 2024-04-05 NOTE — Assessment & Plan Note (Signed)
 Previous tests showed low thyroid  levels. Potential link to palpitations and weight changes. Referral to specialist if abnormalities persist. - Repeat thyroid  function tests. - Consider referral to thyroid  specialist if abnormalities persist.

## 2024-04-08 LAB — POC COVID19 BINAXNOW: SARS Coronavirus 2 Ag: NEGATIVE

## 2024-06-23 ENCOUNTER — Ambulatory Visit
Admission: EM | Admit: 2024-06-23 | Discharge: 2024-06-23 | Disposition: A | Discharge status: Home / Self Care | Attending: Family Medicine | Admitting: Family Medicine

## 2024-06-23 DIAGNOSIS — J4521 Mild intermittent asthma with (acute) exacerbation: Secondary | ICD-10-CM

## 2024-06-23 DIAGNOSIS — B349 Viral infection, unspecified: Secondary | ICD-10-CM | POA: Diagnosis not present

## 2024-06-23 DIAGNOSIS — J988 Other specified respiratory disorders: Secondary | ICD-10-CM

## 2024-06-23 DIAGNOSIS — J4530 Mild persistent asthma, uncomplicated: Secondary | ICD-10-CM | POA: Diagnosis not present

## 2024-06-23 DIAGNOSIS — B9789 Other viral agents as the cause of diseases classified elsewhere: Secondary | ICD-10-CM

## 2024-06-23 DIAGNOSIS — J309 Allergic rhinitis, unspecified: Secondary | ICD-10-CM | POA: Diagnosis not present

## 2024-06-23 MED ORDER — BENZONATATE 200 MG PO CAPS: 200.0000 mg | ORAL_CAPSULE | Freq: Three times a day (TID) | ORAL | 0 refills | Status: AC | PRN

## 2024-06-23 MED ORDER — PREDNISONE 10 MG PO TABS: 30.0000 mg | ORAL_TABLET | Freq: Every day | ORAL | 0 refills | Status: AC

## 2024-06-23 MED ORDER — ALBUTEROL SULFATE HFA 108 (90 BASE) MCG/ACT IN AERS: 1.0000 | INHALATION_SPRAY | Freq: Four times a day (QID) | RESPIRATORY_TRACT | 0 refills | Status: AC | PRN

## 2024-06-23 NOTE — Discharge Instructions (Addendum)
 We will manage this as a viral respiratory infection likely worsened by allergies and asthma. For sore throat or cough try using a honey-based tea. Use 3 teaspoons of honey with juice squeezed from half lemon. Place shaved pieces of ginger into 1/2-1 cup of water and warm over stove top. Then mix the ingredients and repeat every 4 hours as needed. Please take Tylenol 500mg -650mg  once every 6 hours for fevers, aches and pains. Hydrate very well with at least 2 liters (64 ounces) of water. Eat light meals such as soups (chicken and noodles, chicken wild rice, vegetable).  Do not eat any foods that you are allergic to.  Start an antihistamine like Zyrtec (10mg  daily) for postnasal drainage, sinus congestion.  You can take this together with prednisone , the steroid, to help with your sinuses and lungs. Use cough capsules as needed.
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
## 2024-06-23 NOTE — ED Triage Notes (Addendum)
 Pt reports cough and sore throat, headache on and off  x 3 days. Cough is owrse at night. Cough meds (leftover) gives no relief.

## 2024-07-28 ENCOUNTER — Other Ambulatory Visit (HOSPITAL_COMMUNITY): Payer: Self-pay

## 2024-08-17 ENCOUNTER — Ambulatory Visit (INDEPENDENT_AMBULATORY_CARE_PROVIDER_SITE_OTHER): Payer: Self-pay | Admitting: Internal Medicine

## 2024-08-17 ENCOUNTER — Encounter: Payer: Self-pay | Admitting: Internal Medicine

## 2024-08-17 VITALS — BP 110/78 | HR 82 | Temp 98.3°F | Ht 65.0 in | Wt 197.2 lb

## 2024-08-17 DIAGNOSIS — A5901 Trichomonal vulvovaginitis: Secondary | ICD-10-CM

## 2024-08-17 DIAGNOSIS — R233 Spontaneous ecchymoses: Secondary | ICD-10-CM

## 2024-08-17 MED ORDER — TINIDAZOLE 500 MG PO TABS: 2.0000 g | ORAL_TABLET | Freq: Every day | ORAL | 0 refills | Status: AC
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
## 2024-08-17 NOTE — Patient Instructions (Signed)
 Contusion A contusion is a deep bruise. This is a result of an injury that causes bleeding under the skin. Symptoms of bruising include pain, swelling, and discolored skin. The skin may turn blue, purple, or yellow. Follow these instructions at home: Managing pain, stiffness, and swelling You may use RICE. This stands for: Resting. Icing. Compression, or putting pressure on the injured area. Elevating, or raising the injured area. To follow this method, do these actions: Rest the injured area. If told, put ice on the injured area. To do this: Put ice in a plastic bag. Place a towel between your skin and the bag. Leave the ice on for 20 minutes, 2-3 times per day. If your skin turns bright red, take off the ice right away to prevent skin damage. The risk of skin damage is higher if you cannot feel pain, heat, or cold. If told, apply compression on the injured area using an elastic bandage. Make sure the bandage is not too tight. If the area tingles or has a loss of feeling (numbness), remove it and put it back on as told by your doctor. If possible, elevate the injured area above the level of your heart while you are sitting or lying down.  General instructions Take over-the-counter and prescription medicines only as told by your doctor. Keep all follow-up visits. Your doctor may want to see how your contusion is healing with treatment. Contact a doctor if: Your symptoms do not get better after several days of treatment. Your symptoms get worse. You have trouble moving the injured area. Get help right away if: You have very bad pain. You have a loss of feeling (numbness) in a hand or foot. Your hand or foot turns pale or cold. This information is not intended to replace advice given to you by your health care provider. Make sure you discuss any questions you have with your health care provider. Document Revised: 02/05/2022 Document Reviewed: 02/05/2022 Elsevier Patient Education  2024  [employer].

## 2024-09-22 ENCOUNTER — Encounter: Payer: Self-pay | Admitting: Internal Medicine

## 2024-09-22 ENCOUNTER — Other Ambulatory Visit: Payer: Self-pay | Admitting: Internal Medicine

## 2024-09-22 ENCOUNTER — Ambulatory Visit
Admission: RE | Admit: 2024-09-22 | Discharge: 2024-09-22 | Disposition: A | Source: Ambulatory Visit | Attending: Internal Medicine | Admitting: Internal Medicine

## 2024-09-22 DIAGNOSIS — Z1231 Encounter for screening mammogram for malignant neoplasm of breast: Secondary | ICD-10-CM

## 2024-12-02 ENCOUNTER — Encounter: Payer: Self-pay | Admitting: Internal Medicine
